# Patient Record
Sex: Male | Born: 1955 | Race: White | Hispanic: No | Smoking: Current every day smoker
Health system: Southern US, Community
[De-identification: ages and names within clinical notes are randomized; demographics above are authoritative.]

## PROBLEM LIST (undated history)

## (undated) DIAGNOSIS — F329 Major depressive disorder, single episode, unspecified: Secondary | ICD-10-CM

## (undated) DIAGNOSIS — F32A Depression, unspecified: Secondary | ICD-10-CM

## (undated) DIAGNOSIS — J45909 Unspecified asthma, uncomplicated: Secondary | ICD-10-CM

## (undated) DIAGNOSIS — I219 Acute myocardial infarction, unspecified: Secondary | ICD-10-CM

## (undated) DIAGNOSIS — I10 Essential (primary) hypertension: Secondary | ICD-10-CM

## (undated) DIAGNOSIS — IMO0002 Reserved for concepts with insufficient information to code with codable children: Secondary | ICD-10-CM

## (undated) DIAGNOSIS — E119 Type 2 diabetes mellitus without complications: Secondary | ICD-10-CM

## (undated) DIAGNOSIS — E7800 Pure hypercholesterolemia, unspecified: Secondary | ICD-10-CM

## (undated) HISTORY — PX: SPLENECTOMY: SUR1306

## (undated) HISTORY — DX: Pure hypercholesterolemia, unspecified: E78.00

## (undated) HISTORY — DX: Essential (primary) hypertension: I10

## (undated) HISTORY — DX: Reserved for concepts with insufficient information to code with codable children: IMO0002

## (undated) HISTORY — DX: Acute myocardial infarction, unspecified: I21.9

## (undated) HISTORY — PX: BACK SURGERY: SHX140

## (undated) HISTORY — DX: Unspecified asthma, uncomplicated: J45.909

## (undated) HISTORY — DX: Type 2 diabetes mellitus without complications: E11.9

---

## 1997-08-18 ENCOUNTER — Emergency Department (HOSPITAL_COMMUNITY): Admission: EM | Admit: 1997-08-18 | Discharge: 1997-08-18 | Payer: Self-pay | Admitting: Emergency Medicine

## 1998-09-17 ENCOUNTER — Emergency Department (HOSPITAL_COMMUNITY): Admission: EM | Admit: 1998-09-17 | Discharge: 1998-09-17 | Payer: Self-pay | Admitting: Emergency Medicine

## 1999-04-01 ENCOUNTER — Emergency Department (HOSPITAL_COMMUNITY): Admission: EM | Admit: 1999-04-01 | Discharge: 1999-04-01 | Payer: Self-pay | Admitting: Emergency Medicine

## 1999-07-24 ENCOUNTER — Inpatient Hospital Stay (HOSPITAL_COMMUNITY): Admission: EM | Admit: 1999-07-24 | Discharge: 1999-07-29 | Payer: Self-pay | Admitting: Emergency Medicine

## 1999-08-19 ENCOUNTER — Inpatient Hospital Stay (HOSPITAL_COMMUNITY): Admission: EM | Admit: 1999-08-19 | Discharge: 1999-08-26 | Payer: Self-pay | Admitting: Psychiatry

## 1999-12-27 ENCOUNTER — Emergency Department (HOSPITAL_COMMUNITY): Admission: EM | Admit: 1999-12-27 | Discharge: 1999-12-27 | Payer: Self-pay | Admitting: Emergency Medicine

## 1999-12-27 ENCOUNTER — Encounter: Payer: Self-pay | Admitting: Emergency Medicine

## 2000-01-03 ENCOUNTER — Encounter (INDEPENDENT_AMBULATORY_CARE_PROVIDER_SITE_OTHER): Payer: Self-pay

## 2000-01-03 ENCOUNTER — Inpatient Hospital Stay (HOSPITAL_COMMUNITY): Admission: EM | Admit: 2000-01-03 | Discharge: 2000-01-11 | Payer: Self-pay | Admitting: Internal Medicine

## 2000-01-04 ENCOUNTER — Encounter: Payer: Self-pay | Admitting: Internal Medicine

## 2000-01-09 ENCOUNTER — Encounter: Payer: Self-pay | Admitting: Internal Medicine

## 2000-08-27 ENCOUNTER — Inpatient Hospital Stay (HOSPITAL_COMMUNITY): Admission: EM | Admit: 2000-08-27 | Discharge: 2000-09-03 | Payer: Self-pay | Admitting: Psychiatry

## 2001-07-22 ENCOUNTER — Emergency Department (HOSPITAL_COMMUNITY): Admission: EM | Admit: 2001-07-22 | Discharge: 2001-07-22 | Payer: Self-pay | Admitting: Emergency Medicine

## 2001-08-01 ENCOUNTER — Encounter: Admission: RE | Admit: 2001-08-01 | Discharge: 2001-08-01 | Payer: Self-pay | Admitting: Infectious Diseases

## 2001-08-12 ENCOUNTER — Encounter: Admission: RE | Admit: 2001-08-12 | Discharge: 2001-08-12 | Payer: Self-pay | Admitting: Infectious Diseases

## 2001-08-23 ENCOUNTER — Emergency Department (HOSPITAL_COMMUNITY): Admission: EM | Admit: 2001-08-23 | Discharge: 2001-08-23 | Payer: Self-pay | Admitting: Emergency Medicine

## 2002-02-19 ENCOUNTER — Inpatient Hospital Stay (HOSPITAL_COMMUNITY): Admission: EM | Admit: 2002-02-19 | Discharge: 2002-02-20 | Payer: Self-pay | Admitting: *Deleted

## 2002-02-19 ENCOUNTER — Encounter: Payer: Self-pay | Admitting: Internal Medicine

## 2002-02-19 ENCOUNTER — Encounter: Payer: Self-pay | Admitting: *Deleted

## 2002-04-15 ENCOUNTER — Encounter: Payer: Self-pay | Admitting: Emergency Medicine

## 2002-04-15 ENCOUNTER — Inpatient Hospital Stay (HOSPITAL_COMMUNITY): Admission: EM | Admit: 2002-04-15 | Discharge: 2002-04-15 | Payer: Self-pay | Admitting: Emergency Medicine

## 2002-04-18 ENCOUNTER — Emergency Department (HOSPITAL_COMMUNITY): Admission: EM | Admit: 2002-04-18 | Discharge: 2002-04-18 | Payer: Self-pay | Admitting: Emergency Medicine

## 2002-05-24 ENCOUNTER — Emergency Department (HOSPITAL_COMMUNITY): Admission: EM | Admit: 2002-05-24 | Discharge: 2002-05-24 | Payer: Self-pay | Admitting: Emergency Medicine

## 2002-05-24 ENCOUNTER — Encounter: Payer: Self-pay | Admitting: Emergency Medicine

## 2002-06-13 ENCOUNTER — Emergency Department (HOSPITAL_COMMUNITY): Admission: EM | Admit: 2002-06-13 | Discharge: 2002-06-13 | Payer: Self-pay | Admitting: Emergency Medicine

## 2002-06-13 ENCOUNTER — Encounter: Payer: Self-pay | Admitting: Emergency Medicine

## 2002-10-03 ENCOUNTER — Emergency Department (HOSPITAL_COMMUNITY): Admission: EM | Admit: 2002-10-03 | Discharge: 2002-10-03 | Payer: Self-pay | Admitting: Emergency Medicine

## 2003-06-14 ENCOUNTER — Emergency Department (HOSPITAL_COMMUNITY): Admission: AD | Admit: 2003-06-14 | Discharge: 2003-06-14 | Payer: Self-pay | Admitting: Family Medicine

## 2003-09-15 ENCOUNTER — Emergency Department (HOSPITAL_COMMUNITY): Admission: EM | Admit: 2003-09-15 | Discharge: 2003-09-15 | Payer: Self-pay | Admitting: *Deleted

## 2005-03-06 ENCOUNTER — Ambulatory Visit: Payer: Self-pay | Admitting: Internal Medicine

## 2005-03-10 ENCOUNTER — Ambulatory Visit: Payer: Self-pay | Admitting: *Deleted

## 2005-03-21 ENCOUNTER — Ambulatory Visit: Payer: Self-pay | Admitting: Internal Medicine

## 2005-08-10 ENCOUNTER — Ambulatory Visit: Payer: Self-pay | Admitting: *Deleted

## 2005-11-27 ENCOUNTER — Emergency Department (HOSPITAL_COMMUNITY): Admission: EM | Admit: 2005-11-27 | Discharge: 2005-11-27 | Payer: Self-pay | Admitting: Emergency Medicine

## 2006-01-25 ENCOUNTER — Ambulatory Visit: Payer: Self-pay | Admitting: Infectious Diseases

## 2006-01-25 ENCOUNTER — Inpatient Hospital Stay (HOSPITAL_COMMUNITY): Admission: EM | Admit: 2006-01-25 | Discharge: 2006-02-02 | Payer: Self-pay | Admitting: Emergency Medicine

## 2006-01-25 ENCOUNTER — Ambulatory Visit: Payer: Self-pay | Admitting: Cardiology

## 2006-01-26 ENCOUNTER — Encounter: Payer: Self-pay | Admitting: Cardiology

## 2006-02-12 ENCOUNTER — Emergency Department (HOSPITAL_COMMUNITY): Admission: EM | Admit: 2006-02-12 | Discharge: 2006-02-12 | Payer: Self-pay | Admitting: Family Medicine

## 2006-02-12 ENCOUNTER — Ambulatory Visit: Payer: Self-pay | Admitting: Internal Medicine

## 2006-05-29 ENCOUNTER — Ambulatory Visit: Payer: Self-pay | Admitting: Internal Medicine

## 2006-06-19 ENCOUNTER — Ambulatory Visit: Payer: Self-pay | Admitting: Internal Medicine

## 2006-11-14 ENCOUNTER — Encounter (INDEPENDENT_AMBULATORY_CARE_PROVIDER_SITE_OTHER): Payer: Self-pay | Admitting: *Deleted

## 2007-02-14 ENCOUNTER — Ambulatory Visit: Payer: Self-pay | Admitting: Family Medicine

## 2007-02-14 ENCOUNTER — Observation Stay (HOSPITAL_COMMUNITY): Admission: EM | Admit: 2007-02-14 | Discharge: 2007-02-15 | Payer: Self-pay | Admitting: Emergency Medicine

## 2007-03-27 ENCOUNTER — Ambulatory Visit: Payer: Self-pay | Admitting: Internal Medicine

## 2007-05-08 ENCOUNTER — Ambulatory Visit: Payer: Self-pay | Admitting: Internal Medicine

## 2007-05-09 ENCOUNTER — Inpatient Hospital Stay (HOSPITAL_COMMUNITY): Admission: EM | Admit: 2007-05-09 | Discharge: 2007-05-10 | Payer: Self-pay | Admitting: Emergency Medicine

## 2007-05-30 ENCOUNTER — Ambulatory Visit: Payer: Self-pay | Admitting: Internal Medicine

## 2007-07-21 ENCOUNTER — Emergency Department (HOSPITAL_COMMUNITY): Admission: EM | Admit: 2007-07-21 | Discharge: 2007-07-21 | Payer: Self-pay | Admitting: Emergency Medicine

## 2007-07-25 ENCOUNTER — Ambulatory Visit: Payer: Self-pay | Admitting: Internal Medicine

## 2007-07-28 ENCOUNTER — Emergency Department (HOSPITAL_COMMUNITY): Admission: EM | Admit: 2007-07-28 | Discharge: 2007-07-28 | Payer: Self-pay | Admitting: Emergency Medicine

## 2007-08-27 ENCOUNTER — Emergency Department (HOSPITAL_COMMUNITY): Admission: EM | Admit: 2007-08-27 | Discharge: 2007-08-27 | Payer: Self-pay | Admitting: Emergency Medicine

## 2007-10-02 ENCOUNTER — Inpatient Hospital Stay (HOSPITAL_COMMUNITY): Admission: EM | Admit: 2007-10-02 | Discharge: 2007-10-10 | Payer: Self-pay | Admitting: Emergency Medicine

## 2008-12-23 ENCOUNTER — Emergency Department (HOSPITAL_COMMUNITY): Admission: EM | Admit: 2008-12-23 | Discharge: 2008-12-23 | Payer: Self-pay | Admitting: Emergency Medicine

## 2009-03-11 ENCOUNTER — Emergency Department (HOSPITAL_COMMUNITY): Admission: EM | Admit: 2009-03-11 | Discharge: 2009-03-11 | Payer: Self-pay | Admitting: Emergency Medicine

## 2009-04-05 ENCOUNTER — Ambulatory Visit: Payer: Self-pay | Admitting: Internal Medicine

## 2009-04-05 ENCOUNTER — Inpatient Hospital Stay (HOSPITAL_COMMUNITY): Admission: EM | Admit: 2009-04-05 | Discharge: 2009-04-13 | Payer: Self-pay | Admitting: Emergency Medicine

## 2009-08-10 ENCOUNTER — Emergency Department (HOSPITAL_COMMUNITY): Admission: EM | Admit: 2009-08-10 | Discharge: 2009-08-10 | Payer: Self-pay | Admitting: Emergency Medicine

## 2009-08-26 ENCOUNTER — Emergency Department (HOSPITAL_COMMUNITY): Admission: EM | Admit: 2009-08-26 | Discharge: 2009-08-26 | Payer: Self-pay | Admitting: Emergency Medicine

## 2009-10-11 ENCOUNTER — Emergency Department (HOSPITAL_COMMUNITY): Admission: EM | Admit: 2009-10-11 | Discharge: 2009-10-11 | Payer: Self-pay | Admitting: Emergency Medicine

## 2009-10-27 ENCOUNTER — Encounter: Admission: RE | Admit: 2009-10-27 | Discharge: 2009-10-27 | Payer: Self-pay | Admitting: Neurosurgery

## 2010-01-04 DIAGNOSIS — F1120 Opioid dependence, uncomplicated: Secondary | ICD-10-CM

## 2010-01-06 ENCOUNTER — Ambulatory Visit: Payer: Self-pay | Admitting: Internal Medicine

## 2010-01-06 ENCOUNTER — Inpatient Hospital Stay (HOSPITAL_COMMUNITY): Admission: EM | Admit: 2010-01-06 | Discharge: 2010-01-07 | Payer: Self-pay | Admitting: Emergency Medicine

## 2010-05-11 LAB — CBC
HCT: 40.1 % (ref 39.0–52.0)
HCT: 42.3 % (ref 39.0–52.0)
Hemoglobin: 13.7 g/dL (ref 13.0–17.0)
MCV: 95.3 fL (ref 78.0–100.0)
Platelets: 305 10*3/uL (ref 150–400)
RBC: 4.25 MIL/uL (ref 4.22–5.81)
RDW: 14.2 % (ref 11.5–15.5)
RDW: 14.3 % (ref 11.5–15.5)
WBC: 11.7 10*3/uL — ABNORMAL HIGH (ref 4.0–10.5)
WBC: 15.2 10*3/uL — ABNORMAL HIGH (ref 4.0–10.5)
WBC: 19.4 10*3/uL — ABNORMAL HIGH (ref 4.0–10.5)

## 2010-05-11 LAB — BLOOD GAS, ARTERIAL
Patient temperature: 98.6
pH, Arterial: 7.389 (ref 7.350–7.450)

## 2010-05-11 LAB — ETHANOL: Alcohol, Ethyl (B): <5 mg/dL (ref 0–10)

## 2010-05-11 LAB — URINALYSIS, ROUTINE W REFLEX MICROSCOPIC
Glucose, UA: NEGATIVE mg/dL
Ketones, ur: NEGATIVE mg/dL
Leukocytes, UA: NEGATIVE
Protein, ur: NEGATIVE mg/dL

## 2010-05-11 LAB — COMPREHENSIVE METABOLIC PANEL
ALT: 48 U/L (ref 0–53)
AST: 128 U/L — ABNORMAL HIGH (ref 0–37)
Albumin: 3.9 g/dL (ref 3.5–5.2)
Alkaline Phosphatase: 59 U/L (ref 39–117)
Chloride: 92 mEq/L — ABNORMAL LOW (ref 96–112)
Potassium: 4.2 mEq/L (ref 3.5–5.1)
Sodium: 129 mEq/L — ABNORMAL LOW (ref 135–145)
Total Bilirubin: 0.8 mg/dL (ref 0.3–1.2)
Total Protein: 6.8 g/dL (ref 6.0–8.3)

## 2010-05-11 LAB — BASIC METABOLIC PANEL
BUN: 16 mg/dL (ref 6–23)
BUN: 30 mg/dL — ABNORMAL HIGH (ref 6–23)
Chloride: 102 mEq/L (ref 96–112)
Creatinine, Ser: 1.9 mg/dL — ABNORMAL HIGH (ref 0.4–1.5)
GFR calc non Af Amer: 37 mL/min — ABNORMAL LOW (ref >60)
Glucose, Bld: 124 mg/dL — ABNORMAL HIGH (ref 70–99)
Potassium: 4.7 mEq/L (ref 3.5–5.1)

## 2010-05-11 LAB — DIFFERENTIAL
Basophils Relative: 0 % (ref 0–1)
Eosinophils Absolute: 0.3 10*3/uL (ref 0.0–0.7)
Eosinophils Relative: 2 % (ref 0–5)
Monocytes Absolute: 1.8 10*3/uL — ABNORMAL HIGH (ref 0.1–1.0)
Monocytes Relative: 11 % (ref 3–12)

## 2010-05-11 LAB — TROPONIN I: Troponin I: 1.6 ng/mL (ref 0.00–0.06)

## 2010-05-11 LAB — URINE CULTURE
Colony Count: NO GROWTH
Culture  Setup Time: 201111100308
Culture: NO GROWTH

## 2010-05-11 LAB — LIPID PANEL
Cholesterol: 151 mg/dL (ref 0–200)
HDL: 37 mg/dL — ABNORMAL LOW (ref >39)
Triglycerides: 71 mg/dL (ref <150)

## 2010-05-11 LAB — CARDIAC PANEL(CRET KIN+CKTOT+MB+TROPI)
CK, MB: 160.9 ng/mL (ref 0.3–4.0)
Total CK: 5625 U/L — ABNORMAL HIGH (ref 7–232)
Troponin I: 4.44 ng/mL (ref 0.00–0.06)

## 2010-05-11 LAB — POCT CARDIAC MARKERS
CKMB, poc: 75.8 ng/mL (ref 1.0–8.0)
Myoglobin, poc: 470 ng/mL (ref 12–200)
Troponin i, poc: 0.37 ng/mL (ref 0.00–0.09)

## 2010-05-11 LAB — APTT: aPTT: 21 seconds — ABNORMAL LOW (ref 24–37)

## 2010-05-11 LAB — HEPARIN LEVEL (UNFRACTIONATED)
Heparin Unfractionated: 0.11 IU/mL — ABNORMAL LOW (ref 0.30–0.70)
Heparin Unfractionated: <0.1 IU/mL — ABNORMAL LOW (ref 0.30–0.70)

## 2010-05-11 LAB — RAPID URINE DRUG SCREEN, HOSP PERFORMED
Amphetamines: NOT DETECTED
Barbiturates: NOT DETECTED
Benzodiazepines: POSITIVE — AB

## 2010-05-11 LAB — URINE MICROSCOPIC-ADD ON

## 2010-05-11 LAB — LACTIC ACID, PLASMA: Lactic Acid, Venous: 1.5 mmol/L (ref 0.5–2.2)

## 2010-05-12 NOTE — Discharge Summary (Signed)
NAMEYOSHIMI, [name]               ACCOUNT NO.:  [id number]  MEDICAL RECORD NO.:  [id number]          PATIENT TYPE:  INP  LOCATION:  4738                         FACILITY:  [facility]  PHYSICIAN:  Rollene Rotunda, MD, FACCDATE OF BIRTH:  March 14, 1955  DATE OF ADMISSION:  01/05/2010 DATE OF DISCHARGE:  01/07/2010                              DISCHARGE SUMMARY   DISCHARGE DIAGNOSES: 1. Elevated cardiac enzymes with multifactorial etiology.     a.     Known coronary artery disease, history renal insufficiency,      currently rhabdomyolysis on admission.     b.     Initial point-of-care markers; troponin 0.37.  First full      set of enzymes; CK 5880, troponin 1.6.  Second full set of      enzymes; CK 5625, MB 160.9, relative index 2.9, troponin 3.10.      Third and final set; CK 5370, MB 117.8, relative index 2.2 and      troponin-I 4.44.     c.     The patient left against medical advice prior to any      ischemic evaluation being completed. 2. Rhabdomyolysis.     a.     Initial CK 5880, initial creatinine 5.26 with BUN of 45 up      from baseline creatinine of 0.9 - 1.0. 3. Narcotic overdose with question of suicide attempt.     a.     Psychiatry consult completed with recommendation to      discontinue sitter, okay for discharge from Psychiatry      perspective. 4. Ongoing tobacco abuse (2 ppd).     a.     Tobacco cessation consult completed on January 06, 2010.  SECONDARY DIAGNOSES: 1. Coronary artery disease.     a.     Cardiac catheterization in December 2007:  100% proximal      right coronary artery occlusion, distal right coronary artery and      posterior descending artery supplied left circumflex system, left      circumflex mid 30% stenosis and 50% stenosis involving obtuse      marginal 1, left internal mammary artery and left anterior      descending artery without significant disease.  Normal left      ventricular systolic function with left ventricular ejection  fraction of 60%. 2. Hypertension. 3. Hyperlipidemia. 4. History of renal failure. 5. History of polysubstance abuse. 6. Chronic back pain.  SURGICAL HISTORY: 1. S/P lower back surgery. 2. 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  ALLERGIES AND INTOLERANCES: 1. MORPHINE SULFATE (itching). 2. MEPERIDINE (itching).  PROCEDURES: 1. EKG on January 06, 2010:  Sinus tachycardia, 100 bpm, 1 mm ST     depression in lead 3 and lead 6, otherwise nonspecific ST changes. 2. Chest x-ray on January 06, 2010:  No active cardiopulmonary     disease.  HISTORY OF PRESENT ILLNESS:  [name] is a 55 year old gentleman with the above-noted complex medical history who presented to the hospital with altered mental status following a drug overdose with narcotics.  The patient takes oxymorphone 10  mg three tablets p.o. daily as well as oxycodone for chronic low back pain.  According to the patient, he discovered his girlfriend passed away yesterday may have taken more of his regular pain medicine than usual, question suicide attempt.  The patient has been obviously very depressed about the news.  The patient was brought to the emergency department by family friend after he was noted to have altered mental status.  Upon arrival, he was given Narcan and he immediately woke up/regained consciousness.  He then complained of diffuse myalgias including chest discomfort.  Initial cardiac enzymes were elevated in the setting of significantly elevated CK as well.  EKG showed nonspecific ST changes in inferior-lateral leads.  Secondary to his complaints of chest discomfort and elevated cardiac enzymes, Cardiology was asked to admit the patient.  The patient is not a reliable historian currently and is very difficult to tease out whether or not this chest discomfort is significantly different from the rest of his myalgias.  Plan at that time was to admit the patient cycle cardiac  enzymes and follow renal function closely as his creatinine was 5.2 on initial BMET.  The patient was also given IV fluids in the emergency department and plan was continue these and follow up with repeat BMETs.  HOSPITAL COURSE:  The patient was admitted and cardiac enzymes were cycled.  Psychiatric Services were contacted for consultation given concern over possible suicide attempt.  It was determination by the cardiologist that the patient's cardiac enzymes were not indicative of acute coronary syndrome, but rather related to his known chronic coronary artery disease and rhabdomyolysis.  The patient had no further chest discomfort and actually left AMA on January 07, 2010, but did have his Psych consult completed beforehand.  Per Psychiatry, the patient was stable for a Psych standpoint for discharge.  Unfortunately no followup appointments or discharge medications could be completed as the patient left prior to being reevaluated by Cardiology.  No ischemic workup was completed prior to his leaving against the medical advice.  DISCHARGE LABS:  WBC 11.7, HGB 14.2, HCT 42.3, PLT count is 324.  WBC differential on admission was within normal limits except for absolute neutrophils at 9.3%, absolute lymphocytes at 6.0% and absolute monocytes at 1.8%.  Protime 12.9, INR 0.95.  Sodium 137, potassium 4.7, chloride 102, bicarb 25, BUN 16, creatinine 0.93, glucose ranged from 80 to 124. Liver function tests within normal limits on admission except for AST 128, albumin 3.9, calcium 8.9, lactic acid venous 1.5.  Point-of-care markers; troponin 0.37, CK-MB of 76.4.  First full set of enzymes; CK 5880, troponin 1.60.  Second full set; CK 5625, MB 160.9, relative index 2.9, troponin-I 3.10.  Third full set; CK 5370, CK-MB 117.8, relative index 2.2 and troponin 4.44.  Total cholesterol 151, triglycerides 71, HDL 37, LDL 100, total cholesterol/HDL ratio 4.1.  Urinary creatinine 56.0, urine sodium  60, urine myoglobin 160.  Urinalysis, cloudy with a large amount of blood, but otherwise within normal limits.  Urine culture, no growth.  FOLLOWUP PLANS AND APPOINTMENTS:  Not applicable.  DISCHARGE MEDICATIONS:  No changes able to be made secondary to placed in leaving against medical advice.  DURATION OF DISCHARGE ENCOUNTER INCLUDING PHYSICIAN TIME:  Thiry five minutes.     Jarrett Ables, PAC   ______________________________ Rollene Rotunda, MD, [facility]    MS/MEDQ  D:  05/03/2010  T:  05/04/2010  Job:  [id number]  Electronically Signed by Jarrett Ables PAC on 05/05/2010 12:54:54 PM Electronically Signed  by Rollene Rotunda MD [facility] on 05/12/2010 07:55:06 PM

## 2010-05-15 LAB — POCT I-STAT, CHEM 8
BUN: 21 mg/dL (ref 6–23)
Chloride: 97 mEq/L (ref 96–112)
Creatinine, Ser: 1.5 mg/dL (ref 0.4–1.5)
Sodium: 130 mEq/L — ABNORMAL LOW (ref 135–145)
TCO2: 28 mmol/L (ref 0–100)

## 2010-05-16 LAB — CBC
HCT: 49.5 % (ref 39.0–52.0)
Hemoglobin: 17.1 g/dL — ABNORMAL HIGH (ref 13.0–17.0)
MCH: 33.4 pg (ref 26.0–34.0)
MCHC: 34.5 g/dL (ref 30.0–36.0)
MCV: 96.8 fL (ref 78.0–100.0)

## 2010-05-16 LAB — RAPID URINE DRUG SCREEN, HOSP PERFORMED
Amphetamines: NOT DETECTED
Barbiturates: NOT DETECTED
Cocaine: NOT DETECTED
Opiates: NOT DETECTED
Tetrahydrocannabinol: NOT DETECTED

## 2010-05-16 LAB — BASIC METABOLIC PANEL
CO2: 26 mEq/L (ref 19–32)
Calcium: 9.6 mg/dL (ref 8.4–10.5)
Chloride: 101 mEq/L (ref 96–112)
Glucose, Bld: 111 mg/dL — ABNORMAL HIGH (ref 70–99)
Potassium: 3.8 mEq/L (ref 3.5–5.1)
Sodium: 137 mEq/L (ref 135–145)

## 2010-05-16 LAB — DIFFERENTIAL
Basophils Relative: 1 % (ref 0–1)
Eosinophils Absolute: 0.6 10*3/uL (ref 0.0–0.7)
Eosinophils Relative: 5 % (ref 0–5)
Lymphs Abs: 5.7 10*3/uL — ABNORMAL HIGH (ref 0.7–4.0)
Monocytes Absolute: 1.2 10*3/uL — ABNORMAL HIGH (ref 0.1–1.0)
Monocytes Relative: 9 % (ref 3–12)

## 2010-05-20 LAB — BASIC METABOLIC PANEL
BUN: 11 mg/dL (ref 6–23)
BUN: 14 mg/dL (ref 6–23)
BUN: 9 mg/dL (ref 6–23)
BUN: 9 mg/dL (ref 6–23)
CO2: 25 mEq/L (ref 19–32)
CO2: 26 mEq/L (ref 19–32)
CO2: 29 mEq/L (ref 19–32)
CO2: 31 mEq/L (ref 19–32)
CO2: 31 mEq/L (ref 19–32)
Calcium: 8.6 mg/dL (ref 8.4–10.5)
Calcium: 8.7 mg/dL (ref 8.4–10.5)
Calcium: 8.7 mg/dL (ref 8.4–10.5)
Calcium: 9 mg/dL (ref 8.4–10.5)
Chloride: 105 mEq/L (ref 96–112)
Chloride: 97 mEq/L (ref 96–112)
Chloride: 99 mEq/L (ref 96–112)
Creatinine, Ser: 0.66 mg/dL (ref 0.4–1.5)
Creatinine, Ser: 0.7 mg/dL (ref 0.4–1.5)
Creatinine, Ser: 0.75 mg/dL (ref 0.4–1.5)
Creatinine, Ser: 0.76 mg/dL (ref 0.4–1.5)
GFR calc Af Amer: >60 mL/min (ref >60)
GFR calc Af Amer: >60 mL/min (ref >60)
GFR calc Af Amer: >60 mL/min (ref >60)
GFR calc non Af Amer: >60 mL/min (ref >60)
GFR calc non Af Amer: >60 mL/min (ref >60)
GFR calc non Af Amer: >60 mL/min (ref >60)
Glucose, Bld: 101 mg/dL — ABNORMAL HIGH (ref 70–99)
Glucose, Bld: 103 mg/dL — ABNORMAL HIGH (ref 70–99)
Glucose, Bld: 108 mg/dL — ABNORMAL HIGH (ref 70–99)
Glucose, Bld: 112 mg/dL — ABNORMAL HIGH (ref 70–99)
Glucose, Bld: 124 mg/dL — ABNORMAL HIGH (ref 70–99)
Glucose, Bld: 93 mg/dL (ref 70–99)
Potassium: 3.4 mEq/L — ABNORMAL LOW (ref 3.5–5.1)
Potassium: 3.4 mEq/L — ABNORMAL LOW (ref 3.5–5.1)
Potassium: 3.6 mEq/L (ref 3.5–5.1)
Potassium: 3.6 mEq/L (ref 3.5–5.1)
Potassium: 3.7 mEq/L (ref 3.5–5.1)
Potassium: 3.8 mEq/L (ref 3.5–5.1)
Sodium: 134 mEq/L — ABNORMAL LOW (ref 135–145)
Sodium: 139 mEq/L (ref 135–145)

## 2010-05-20 LAB — POCT I-STAT 3, ART BLOOD GAS (G3+)
Acid-Base Excess: 2 mmol/L (ref 0.0–2.0)
Bicarbonate: 24 mEq/L (ref 20.0–24.0)
Bicarbonate: 24.2 mEq/L — ABNORMAL HIGH (ref 20.0–24.0)
Bicarbonate: 27.7 mEq/L — ABNORMAL HIGH (ref 20.0–24.0)
Bicarbonate: 29.1 mEq/L — ABNORMAL HIGH (ref 20.0–24.0)
O2 Saturation: 100 %
O2 Saturation: 95 %
O2 Saturation: 97 %
Patient temperature: 97.5
Patient temperature: 98.6
Patient temperature: 99
TCO2: 25 mmol/L (ref 0–100)
TCO2: 26 mmol/L (ref 0–100)
TCO2: 28 mmol/L (ref 0–100)
TCO2: 29 mmol/L (ref 0–100)
pCO2 arterial: 37 mmHg (ref 35.0–45.0)
pCO2 arterial: 44.5 mmHg (ref 35.0–45.0)
pCO2 arterial: 46.1 mmHg — ABNORMAL HIGH (ref 35.0–45.0)
pH, Arterial: 7.43 (ref 7.350–7.450)
pO2, Arterial: 113 mmHg — ABNORMAL HIGH (ref 80.0–100.0)
pO2, Arterial: 80 mmHg (ref 80.0–100.0)
pO2, Arterial: 86 mmHg (ref 80.0–100.0)

## 2010-05-20 LAB — COMPREHENSIVE METABOLIC PANEL WITH GFR
AST: 41 U/L — ABNORMAL HIGH (ref 0–37)
Albumin: 2.5 g/dL — ABNORMAL LOW (ref 3.5–5.2)
Alkaline Phosphatase: 68 U/L (ref 39–117)
BUN: 10 mg/dL (ref 6–23)
Chloride: 105 meq/L (ref 96–112)
GFR calc Af Amer: >60 mL/min (ref >60)
Potassium: 3.7 meq/L (ref 3.5–5.1)
Sodium: 137 meq/L (ref 135–145)
Total Protein: 5.8 g/dL — ABNORMAL LOW (ref 6.0–8.3)

## 2010-05-20 LAB — CBC
HCT: 35.4 % — ABNORMAL LOW (ref 39.0–52.0)
HCT: 36.5 % — ABNORMAL LOW (ref 39.0–52.0)
HCT: 37.3 % — ABNORMAL LOW (ref 39.0–52.0)
HCT: 38.1 % — ABNORMAL LOW (ref 39.0–52.0)
HCT: 40.5 % (ref 39.0–52.0)
HCT: 47.9 % (ref 39.0–52.0)
Hemoglobin: 12.3 g/dL — ABNORMAL LOW (ref 13.0–17.0)
Hemoglobin: 12.8 g/dL — ABNORMAL LOW (ref 13.0–17.0)
Hemoglobin: 12.9 g/dL — ABNORMAL LOW (ref 13.0–17.0)
Hemoglobin: 13.1 g/dL (ref 13.0–17.0)
Hemoglobin: 18.1 g/dL — ABNORMAL HIGH (ref 13.0–17.0)
MCHC: 33.4 g/dL (ref 30.0–36.0)
MCHC: 33.5 g/dL (ref 30.0–36.0)
MCHC: 33.9 g/dL (ref 30.0–36.0)
MCHC: 34.3 g/dL (ref 30.0–36.0)
MCHC: 34.6 g/dL (ref 30.0–36.0)
MCV: 98.8 fL (ref 78.0–100.0)
MCV: 98.9 fL (ref 78.0–100.0)
MCV: 99.4 fL (ref 78.0–100.0)
Platelets: 178 K/uL (ref 150–400)
Platelets: 217 10*3/uL (ref 150–400)
Platelets: 313 10*3/uL (ref 150–400)
Platelets: 338 10*3/uL (ref 150–400)
Platelets: 354 10*3/uL (ref 150–400)
Platelets: 409 10*3/uL — ABNORMAL HIGH (ref 150–400)
RBC: 3.59 MIL/uL — ABNORMAL LOW (ref 4.22–5.81)
RBC: 3.75 MIL/uL — ABNORMAL LOW (ref 4.22–5.81)
RBC: 3.84 MIL/uL — ABNORMAL LOW (ref 4.22–5.81)
RDW: 13.6 % (ref 11.5–15.5)
RDW: 13.6 % (ref 11.5–15.5)
RDW: 13.8 % (ref 11.5–15.5)
RDW: 13.8 % (ref 11.5–15.5)
RDW: 13.9 % (ref 11.5–15.5)
RDW: 14 % (ref 11.5–15.5)
WBC: 14.3 10*3/uL — ABNORMAL HIGH (ref 4.0–10.5)
WBC: 16.5 10*3/uL — ABNORMAL HIGH (ref 4.0–10.5)
WBC: 17.5 K/uL — ABNORMAL HIGH (ref 4.0–10.5)
WBC: 17.7 10*3/uL — ABNORMAL HIGH (ref 4.0–10.5)
WBC: 20.2 10*3/uL — ABNORMAL HIGH (ref 4.0–10.5)
WBC: 20.5 10*3/uL — ABNORMAL HIGH (ref 4.0–10.5)

## 2010-05-20 LAB — POCT CARDIAC MARKERS
CKMB, poc: 21.6 ng/mL (ref 1.0–8.0)
Myoglobin, poc: >500 ng/mL (ref 12–200)

## 2010-05-20 LAB — PHOSPHORUS
Phosphorus: 3.1 mg/dL (ref 2.3–4.6)
Phosphorus: 3.8 mg/dL (ref 2.3–4.6)
Phosphorus: 4.4 mg/dL (ref 2.3–4.6)

## 2010-05-20 LAB — SALICYLATE LEVEL: Salicylate Lvl: <4 mg/dL (ref 2.8–20.0)

## 2010-05-20 LAB — EXPECTORATED SPUTUM ASSESSMENT W GRAM STAIN, RFLX TO RESP C

## 2010-05-20 LAB — CULTURE, BLOOD (ROUTINE X 2)
Culture: NO GROWTH
Culture: NO GROWTH
Culture: NO GROWTH
Culture: NO GROWTH

## 2010-05-20 LAB — COMPREHENSIVE METABOLIC PANEL
ALT: 23 U/L (ref 0–53)
ALT: 26 U/L (ref 0–53)
AST: 51 U/L — ABNORMAL HIGH (ref 0–37)
Albumin: 4 g/dL (ref 3.5–5.2)
Alkaline Phosphatase: 43 U/L (ref 39–117)
Alkaline Phosphatase: 72 U/L (ref 39–117)
BUN: 10 mg/dL (ref 6–23)
CO2: 26 mEq/L (ref 19–32)
Calcium: 8.1 mg/dL — ABNORMAL LOW (ref 8.4–10.5)
Calcium: 9 mg/dL (ref 8.4–10.5)
Chloride: 103 mEq/L (ref 96–112)
Creatinine, Ser: 0.73 mg/dL (ref 0.4–1.5)
GFR calc Af Amer: >60 mL/min (ref >60)
GFR calc non Af Amer: >60 mL/min (ref >60)
GFR calc non Af Amer: >60 mL/min (ref >60)
Glucose, Bld: 106 mg/dL — ABNORMAL HIGH (ref 70–99)
Glucose, Bld: 117 mg/dL — ABNORMAL HIGH (ref 70–99)
Potassium: 3.2 mEq/L — ABNORMAL LOW (ref 3.5–5.1)
Potassium: 4 mEq/L (ref 3.5–5.1)
Sodium: 133 mEq/L — ABNORMAL LOW (ref 135–145)
Total Bilirubin: 0.7 mg/dL (ref 0.3–1.2)
Total Bilirubin: 0.9 mg/dL (ref 0.3–1.2)
Total Protein: 7.8 g/dL (ref 6.0–8.3)

## 2010-05-20 LAB — URINALYSIS, ROUTINE W REFLEX MICROSCOPIC
Bilirubin Urine: NEGATIVE
Bilirubin Urine: NEGATIVE
Glucose, UA: NEGATIVE mg/dL
Hgb urine dipstick: NEGATIVE
Nitrite: NEGATIVE
Specific Gravity, Urine: 1.011 (ref 1.005–1.030)
Specific Gravity, Urine: 1.019 (ref 1.005–1.030)
Urobilinogen, UA: 0.2 mg/dL (ref 0.0–1.0)
pH: 6 (ref 5.0–8.0)
pH: 6 (ref 5.0–8.0)

## 2010-05-20 LAB — RAPID URINE DRUG SCREEN, HOSP PERFORMED
Barbiturates: NOT DETECTED
Cocaine: POSITIVE — AB
Opiates: NOT DETECTED
Tetrahydrocannabinol: POSITIVE — AB

## 2010-05-20 LAB — GLUCOSE, CAPILLARY
Glucose-Capillary: 100 mg/dL — ABNORMAL HIGH (ref 70–99)
Glucose-Capillary: 100 mg/dL — ABNORMAL HIGH (ref 70–99)
Glucose-Capillary: 102 mg/dL — ABNORMAL HIGH (ref 70–99)
Glucose-Capillary: 103 mg/dL — ABNORMAL HIGH (ref 70–99)
Glucose-Capillary: 103 mg/dL — ABNORMAL HIGH (ref 70–99)
Glucose-Capillary: 105 mg/dL — ABNORMAL HIGH (ref 70–99)
Glucose-Capillary: 110 mg/dL — ABNORMAL HIGH (ref 70–99)
Glucose-Capillary: 111 mg/dL — ABNORMAL HIGH (ref 70–99)
Glucose-Capillary: 115 mg/dL — ABNORMAL HIGH (ref 70–99)
Glucose-Capillary: 118 mg/dL — ABNORMAL HIGH (ref 70–99)
Glucose-Capillary: 121 mg/dL — ABNORMAL HIGH (ref 70–99)
Glucose-Capillary: 123 mg/dL — ABNORMAL HIGH (ref 70–99)
Glucose-Capillary: 123 mg/dL — ABNORMAL HIGH (ref 70–99)
Glucose-Capillary: 124 mg/dL — ABNORMAL HIGH (ref 70–99)
Glucose-Capillary: 128 mg/dL — ABNORMAL HIGH (ref 70–99)
Glucose-Capillary: 130 mg/dL — ABNORMAL HIGH (ref 70–99)
Glucose-Capillary: 130 mg/dL — ABNORMAL HIGH (ref 70–99)
Glucose-Capillary: 145 mg/dL — ABNORMAL HIGH (ref 70–99)
Glucose-Capillary: 150 mg/dL — ABNORMAL HIGH (ref 70–99)
Glucose-Capillary: 91 mg/dL (ref 70–99)
Glucose-Capillary: 91 mg/dL (ref 70–99)
Glucose-Capillary: 95 mg/dL (ref 70–99)
Glucose-Capillary: 98 mg/dL (ref 70–99)

## 2010-05-20 LAB — DIFFERENTIAL
Basophils Absolute: 0.1 10*3/uL (ref 0.0–0.1)
Basophils Relative: 0 % (ref 0–1)
Eosinophils Absolute: 0 10*3/uL (ref 0.0–0.7)
Eosinophils Absolute: 1 10*3/uL — ABNORMAL HIGH (ref 0.0–0.7)
Eosinophils Relative: 0 % (ref 0–5)
Eosinophils Relative: 6 % — ABNORMAL HIGH (ref 0–5)
Lymphocytes Relative: 26 % (ref 12–46)
Lymphs Abs: 2.4 10*3/uL (ref 0.7–4.0)
Lymphs Abs: 4.7 10*3/uL — ABNORMAL HIGH (ref 0.7–4.0)
Monocytes Absolute: 1.5 10*3/uL — ABNORMAL HIGH (ref 0.1–1.0)
Monocytes Relative: 7 % (ref 3–12)
Neutrophils Relative %: 59 % (ref 43–77)
Neutrophils Relative %: 81 % — ABNORMAL HIGH (ref 43–77)

## 2010-05-20 LAB — TRIGLYCERIDES: Triglycerides: 217 mg/dL — ABNORMAL HIGH (ref <150)

## 2010-05-20 LAB — CULTURE, RESPIRATORY W GRAM STAIN

## 2010-05-20 LAB — CK TOTAL AND CKMB (NOT AT ARMC)
CK, MB: 0.7 ng/mL (ref 0.3–4.0)
Relative Index: 0 (ref 0.0–2.5)

## 2010-05-20 LAB — BRAIN NATRIURETIC PEPTIDE: Pro B Natriuretic peptide (BNP): 72 pg/mL (ref 0.0–100.0)

## 2010-05-20 LAB — BLOOD GAS, ARTERIAL
Acid-Base Excess: 5.1 mmol/L — ABNORMAL HIGH (ref 0.0–2.0)
Bicarbonate: 30 mEq/L — ABNORMAL HIGH (ref 20.0–24.0)
FIO2: 0.4 %
MECHVT: 410 mL
O2 Saturation: 97.4 %
O2 Saturation: 98.6 %
PEEP: 5 cmH2O
PEEP: 5 cmH2O
Patient temperature: 98.6
RATE: 20 resp/min
TCO2: 31.2 mmol/L (ref 0–100)
pO2, Arterial: 116 mmHg — ABNORMAL HIGH (ref 80.0–100.0)
pO2, Arterial: 82.2 mmHg (ref 80.0–100.0)

## 2010-05-20 LAB — PROTIME-INR: INR: 1 (ref 0.00–1.49)

## 2010-05-20 LAB — CARDIAC PANEL(CRET KIN+CKTOT+MB+TROPI)
CK, MB: 0.7 ng/mL (ref 0.3–4.0)
CK, MB: 20.3 ng/mL (ref 0.3–4.0)
CK, MB: 35.1 ng/mL (ref 0.3–4.0)
Relative Index: 0.2 (ref 0.0–2.5)
Relative Index: 0.2 (ref 0.0–2.5)
Relative Index: 0.4 (ref 0.0–2.5)
Relative Index: 0.6 (ref 0.0–2.5)
Total CK: 418 U/L — ABNORMAL HIGH (ref 7–232)
Total CK: 4575 U/L — ABNORMAL HIGH (ref 7–232)
Total CK: 5463 U/L — ABNORMAL HIGH (ref 7–232)

## 2010-05-20 LAB — POCT I-STAT, CHEM 8
BUN: 13 mg/dL (ref 6–23)
Calcium, Ion: 1.04 mmol/L — ABNORMAL LOW (ref 1.12–1.32)
Glucose, Bld: 141 mg/dL — ABNORMAL HIGH (ref 70–99)
HCT: 57 % — ABNORMAL HIGH (ref 39.0–52.0)
TCO2: 26 mmol/L (ref 0–100)

## 2010-05-20 LAB — APTT: aPTT: 28 seconds (ref 24–37)

## 2010-05-20 LAB — BASIC METABOLIC PANEL WITH GFR
Calcium: 7.9 mg/dL — ABNORMAL LOW (ref 8.4–10.5)
Creatinine, Ser: 0.87 mg/dL (ref 0.4–1.5)
GFR calc Af Amer: >60 mL/min (ref >60)
GFR calc non Af Amer: >60 mL/min (ref >60)
Sodium: 135 meq/L (ref 135–145)

## 2010-05-20 LAB — CULTURE, BAL-QUANTITATIVE W GRAM STAIN

## 2010-05-20 LAB — URINE CULTURE

## 2010-05-20 LAB — MRSA PCR SCREENING: MRSA by PCR: NEGATIVE

## 2010-05-20 LAB — MAGNESIUM
Magnesium: 2 mg/dL (ref 1.5–2.5)
Magnesium: 2.1 mg/dL (ref 1.5–2.5)
Magnesium: 2.1 mg/dL (ref 1.5–2.5)

## 2010-05-20 LAB — LACTIC ACID, PLASMA: Lactic Acid, Venous: 3.1 mmol/L — ABNORMAL HIGH (ref 0.5–2.2)

## 2010-05-20 LAB — CHOLESTEROL, TOTAL: Cholesterol: 148 mg/dL (ref 0–200)

## 2010-06-02 LAB — DIFFERENTIAL
Basophils Absolute: 0.2 10*3/uL — ABNORMAL HIGH (ref 0.0–0.1)
Basophils Relative: 2 % — ABNORMAL HIGH (ref 0–1)
Eosinophils Relative: 3 % (ref 0–5)
Monocytes Absolute: 0.8 10*3/uL (ref 0.1–1.0)
Neutro Abs: 5.3 10*3/uL (ref 1.7–7.7)

## 2010-06-02 LAB — COMPREHENSIVE METABOLIC PANEL
Albumin: 3.9 g/dL (ref 3.5–5.2)
Alkaline Phosphatase: 58 U/L (ref 39–117)
BUN: 10 mg/dL (ref 6–23)
CO2: 23 mEq/L (ref 19–32)
Chloride: 103 mEq/L (ref 96–112)
GFR calc non Af Amer: >60 mL/min (ref >60)
Potassium: 4 mEq/L (ref 3.5–5.1)
Total Bilirubin: 0.6 mg/dL (ref 0.3–1.2)

## 2010-06-02 LAB — POCT I-STAT, CHEM 8
Calcium, Ion: 1.11 mmol/L — ABNORMAL LOW (ref 1.12–1.32)
Chloride: 102 mEq/L (ref 96–112)
HCT: 49 % (ref 39.0–52.0)
Sodium: 135 mEq/L (ref 135–145)
TCO2: 23 mmol/L (ref 0–100)

## 2010-06-02 LAB — CBC
HCT: 47.2 % (ref 39.0–52.0)
Hemoglobin: 16.1 g/dL (ref 13.0–17.0)
MCV: 97.9 fL (ref 78.0–100.0)
Platelets: 385 10*3/uL (ref 150–400)
RBC: 4.82 MIL/uL (ref 4.22–5.81)
WBC: 12 10*3/uL — ABNORMAL HIGH (ref 4.0–10.5)

## 2010-06-02 LAB — POCT CARDIAC MARKERS

## 2010-06-25 IMAGING — CR DG CHEST 1V PORT
1 series · 1 of 1 positions shown · non-contrast
Comparison: Chest radiograph performed 12/23/2008, and CT of the
chest performed 03/11/2009

CLINICAL DATA: Endotracheal tube insertion; patient found
unresponsive.

PORTABLE CHEST - 1 VIEW

[AP]
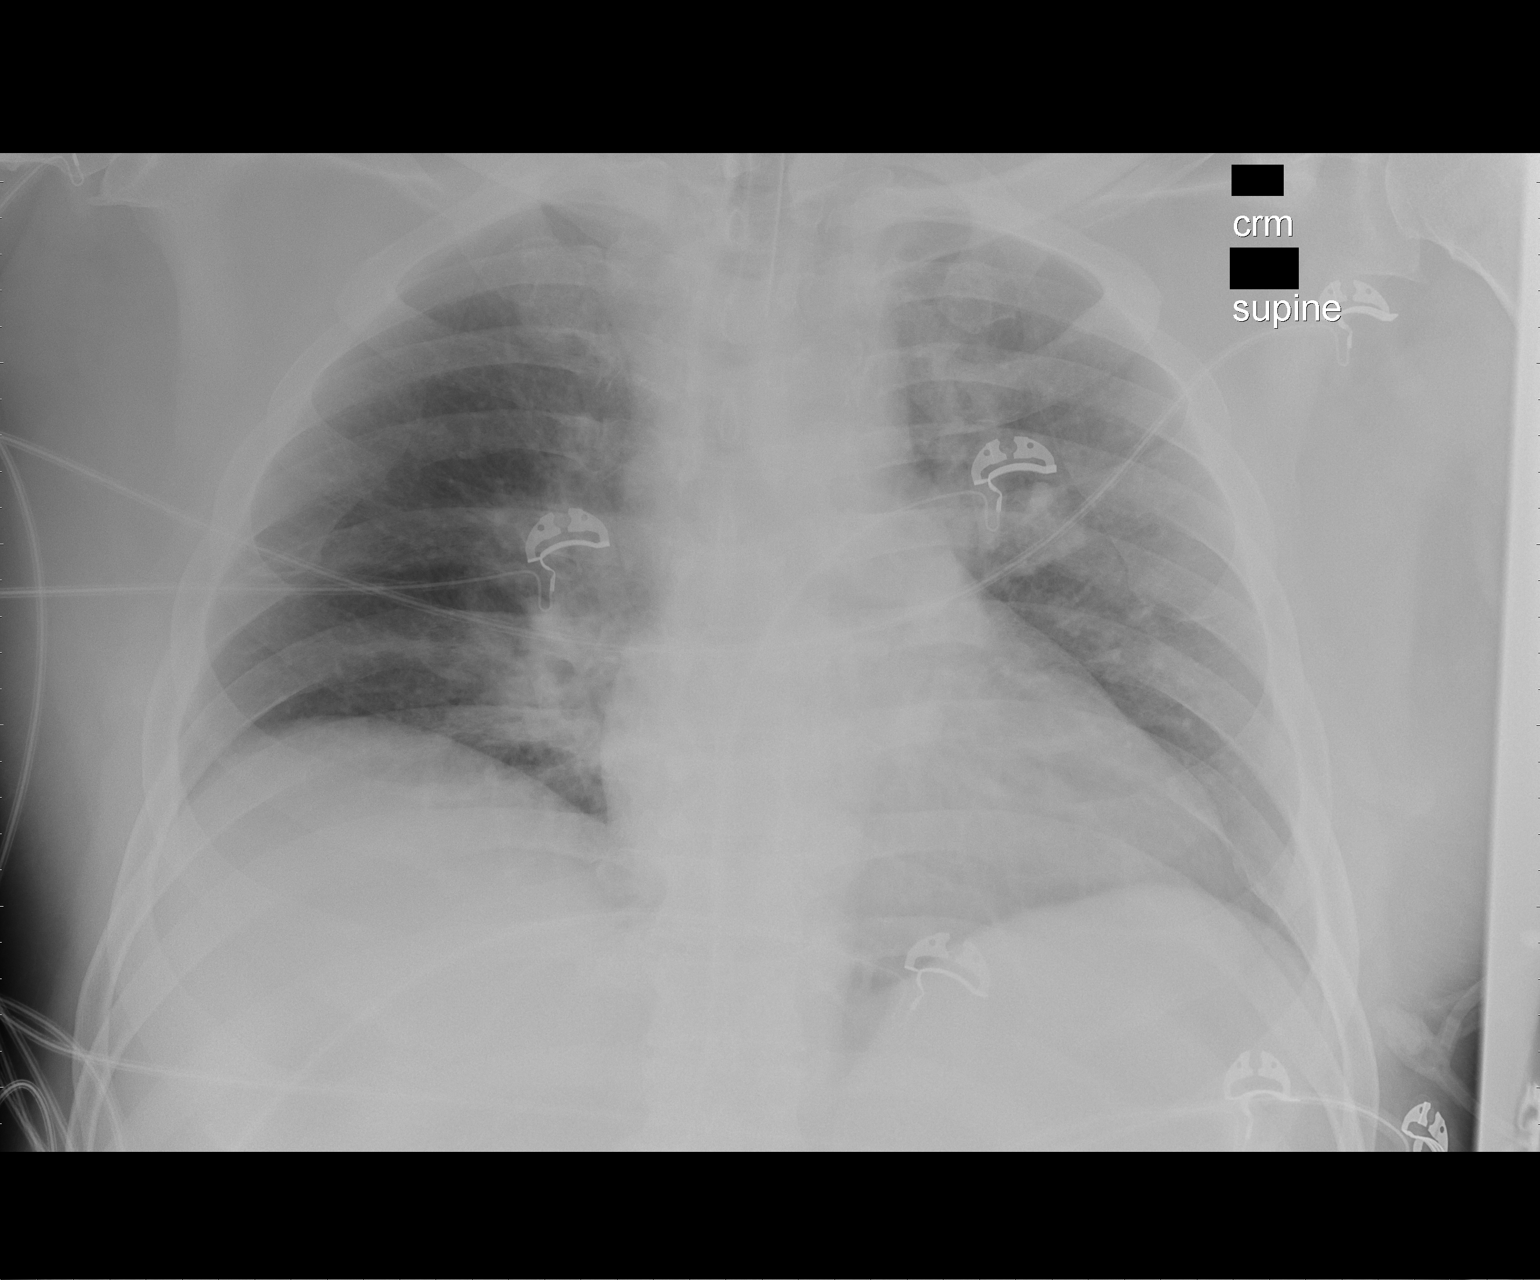

[1 of 1 positions shown; findings below may reference images not displayed]

FINDINGS: The patient's endotracheal tube is seen ending 4-5 cm
above the carina.

The lungs are hypoexpanded; mild vascular crowding is noted.
Medial right basilar opacity may reflect atelectasis or pneumonia.
Mild left-sided atelectasis is noted.  No pleural effusion or
pneumothorax is seen.

The cardiomediastinal silhouette is within normal limits, given
technique.  No acute osseous abnormalities are identified.
IMPRESSION: 1.  Endotracheal tube seen ending 4-5 cm above the carina.
2.  Hypoexpanded lungs; medial right basilar opacity may reflect
atelectasis or pneumonia.  Mild left-sided atelectasis noted.

## 2010-06-27 IMAGING — CR DG CHEST 1V PORT
1 series · 1 of 1 positions shown · non-contrast
Comparison: 04/05/2009

CLINICAL DATA: Respiratory failure.

PORTABLE CHEST - 1 VIEW

[AP]
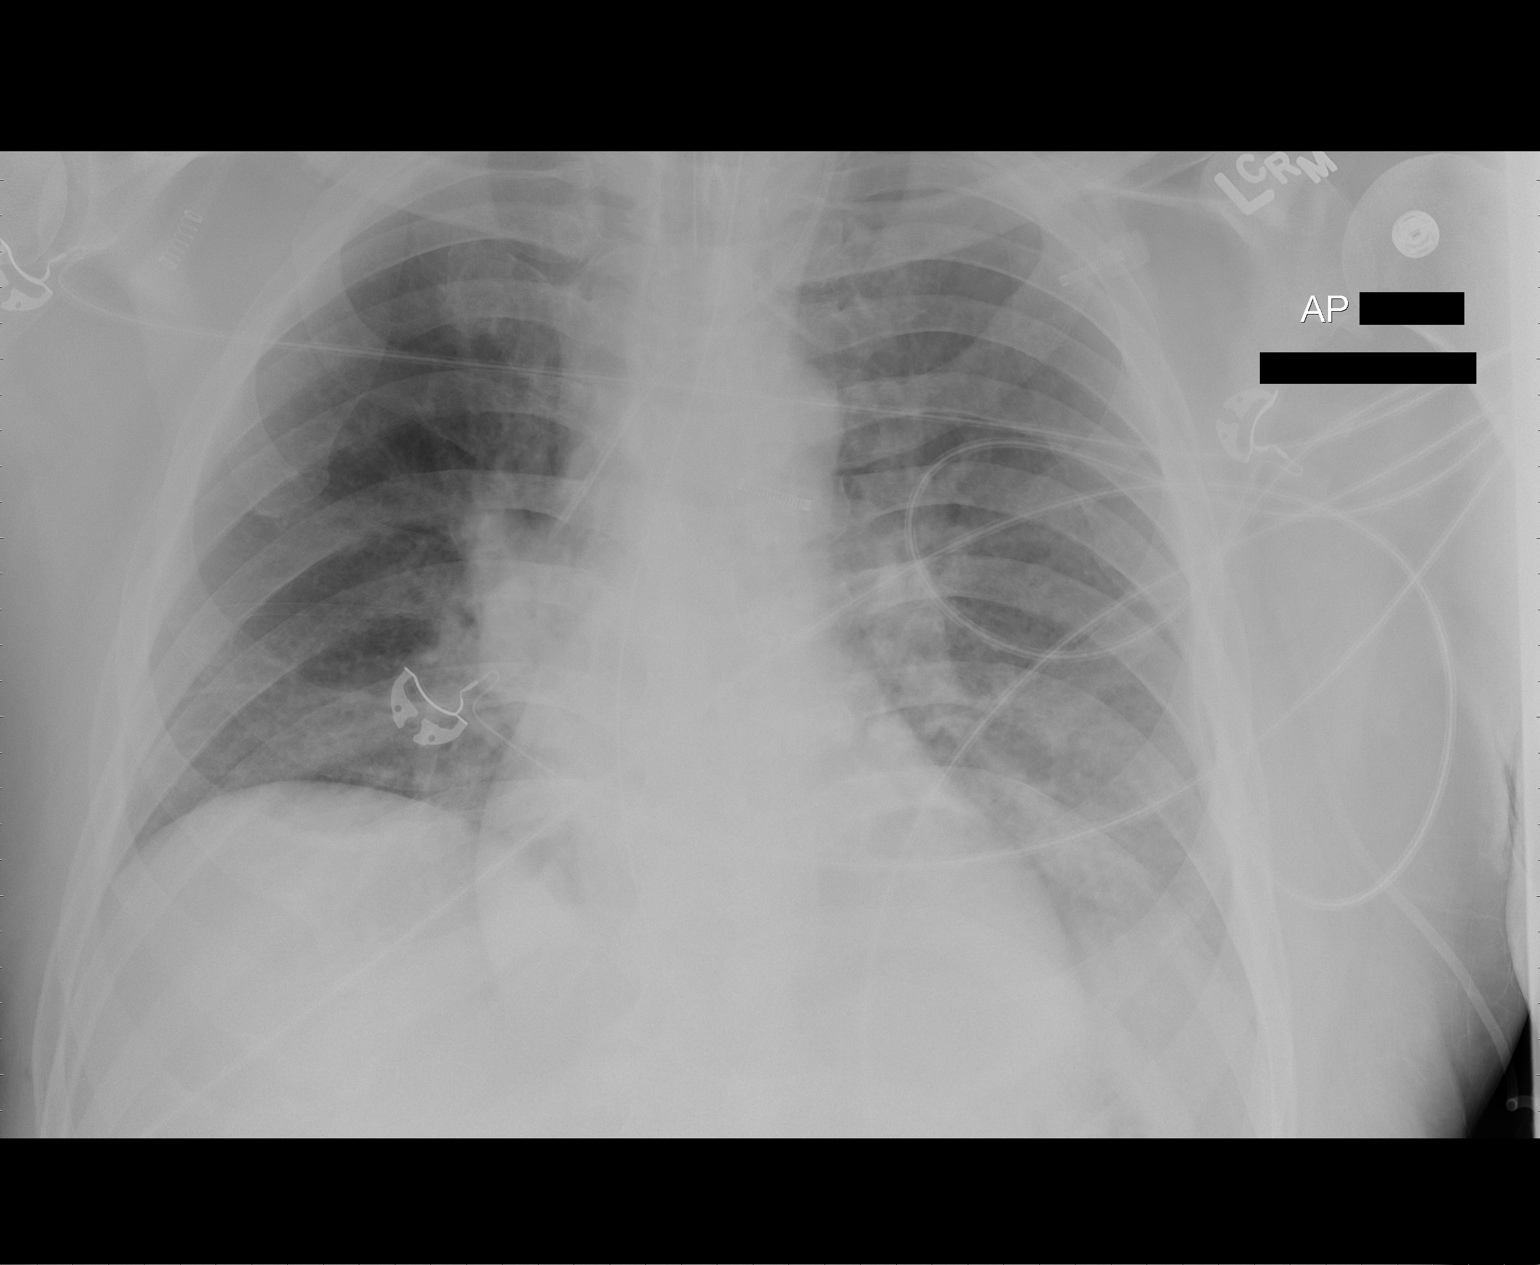

[1 of 1 positions shown; findings below may reference images not displayed]

FINDINGS: The endotracheal tube and left IJ catheters are stable.
There is a new NG tube coursing down the esophagus and into the
stomach.  The heart lungs are stable.  Persistent areas of
atelectasis and probable small patchy infiltrates.  No pleural
effusions.
IMPRESSION: 1.  Support apparatus in good position.
2.  Persistent streaky areas of atelectasis and possible small
infiltrates.

## 2010-06-30 IMAGING — CR DG CHEST 1V PORT
1 series · 1 of 1 positions shown · non-contrast
Comparison: Portable chest x-ray of 04/09/2009

CLINICAL DATA: Respiratory distress, overdose

PORTABLE CHEST - 1 VIEW

[view not recorded]
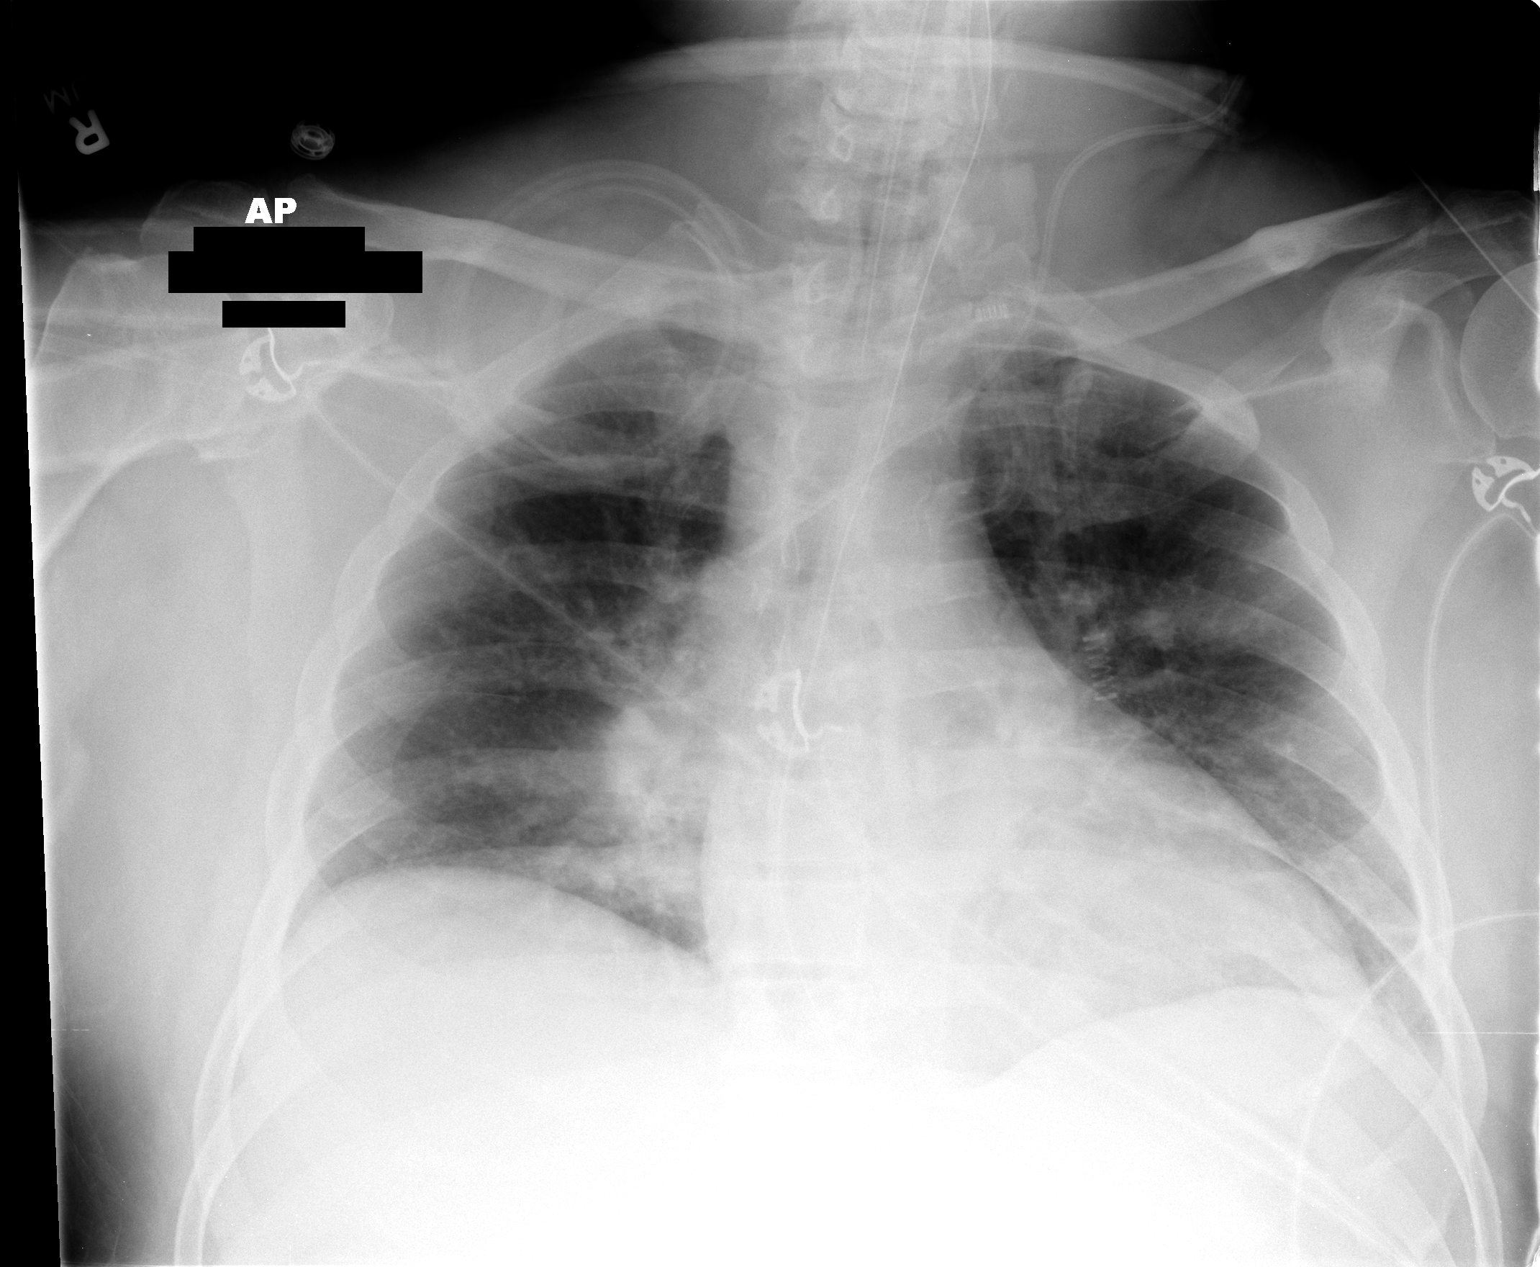

[1 of 1 positions shown; findings below may reference images not displayed]

FINDINGS: The lungs are better aerated with decreasing basilar
atelectasis.  Mild cardiomegaly is stable.  There may be mild
pulmonary vascular congestion remaining.  Endotracheal tube and
left IJ central venous catheter are unchanged in position, and an
NG tube remains.
IMPRESSION: Improved aeration with decreasing basilar opacities.  Cardiomegaly
is present with perhaps minimal pulmonary vascular congestion.

## 2010-07-12 NOTE — Discharge Summary (Signed)
NAME:  [name], [name]               ACCOUNT NO.:  [id number]   MEDICAL RECORD NO.:  [id number]          PATIENT TYPE:  INP   LOCATION:  1508                         FACILITY:  [facility]   PHYSICIAN:  Beckey Rutter, MD  DATE OF BIRTH:  01/10/1956   DATE OF ADMISSION:  10/01/2007  DATE OF DISCHARGE:                               DISCHARGE SUMMARY   PRIMARY CARE PHYSICIAN:  This patient is unassigned.   HISTORY OF PRESENT ILLNESS/CHIEF COMPLAINT:  A 55 year old gentlemanman  multiple medical problem presented to the emergency department on October 05, 2007, with nausea, vomiting and diarrhea and muscle aches and  generalized aches and pain.   HOSPITAL STAY:  1. Generalized aches and pain.  Patient is ruled out for H1N1.  2. Patient required pain medication for this aching and pain during      the hospital stay.  I suspect that the patient has low tolerance      for pain together with possible drug-seeking behavior.  3. Wheezing and chest pain and excessive yellow copious sputum.  This      is fault secondary to COPD with bronchiolitis.  Patient was      continued on antibiotic and recently his intravenous steroid was      changed to p.o. steroid.  Currently his breathing is improving.  4. Abdominal pain and nausea which is presenting symptoms remained      stable with essentially negative CAT scan as below.  5. Diabetes.  Patient was continued on a sliding scale with fair      glycemic control.  6. Hypertension.  Patient was started on antihypertensive medication;      lisinopril/hydrochlorothiazide.  We will continue to monitor blood pressure control.  1. Peripheral neuropathy.  This is associated with generalized aches      and pains.  The patient is on Neurontin.   DISCHARGE DIAGNOSES:  1. Generalized aches and pain.  2. Patient rule out for H1N1.  3. Chronic obstructive pulmonary disease with acute exacerbation.  4. History of polysubstance abuse.  5. History of hepatitis B  virus, hepatitis D virus.  6. 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  7. Fibromyalgia and chronic pain.  8. Medical noncompliance.  9. History of paroxysmal atrial fibrillation 2009.  10.Diabetes.  11.Hypertension.  12.Hyperlipidemia.  13.Chronic obstructive pulmonary disease with acute exacerbation as      discussed above.   HOSPITAL PROCEDURES:  1. Chest x-ray on October 01, 2007.  Impression:  Possible bronchitis,      no infiltrate.  Chest x-ray without contrast on October 01, 2007, showing no sign of  developing mass, 3-4 mm density in the right middle lobe appears the  same.  Chest radiographic finding must have been due to overlapping  shadows.  Chest x-ray on October 02, 2007, showing a slight increase in prominence  of interstitial markings.  This could reflect interstitial pneumonia.  No consolidation or collapse.  1. On October 02, 2007, the patient had CT abdomen without contrast to      evaluate for the nausea and nonspecific abdominal pain.  Impression  was showing upper abdominal lymphadenopathies nonspecific and      appears similar compared to the CT of March 2009.  In the absence      of nonmalignancy, this is most likely inflammatory in nature.  This      appearance can be seen in the setting of chronic inflammatory such      ashepatitis.  Absence of the spleen, 1.3 cm soft tissue nodule in the left upper  quadrant may represent a accessory spleen and splenule.  1. No acute finding is identified in the abdomen.   DISCHARGE MEDICATIONS:  Dictated on the day of actual discharge.   PLAN OF CARE NOW:  Patient is approaching stability.  His chest  examination seems stable with very minimal wheezes currently.  The  amount of sputum production also reduced to very minimal.  I suspect the  patient will need to continue on antibiotic and steroid to be tapered  off.  I told [name] that if he remained stable the way he is, we  will discharge him within a day or  2.   Time spent on discharge, discussion with the patient and assessment is  more than 30 minutes.      Beckey Rutter, MD  Electronically Signed     EME/MEDQ  D:  10/08/2007  T:  10/08/2007  Job:  [id number]

## 2010-07-12 NOTE — Discharge Summary (Signed)
NAME:  [name], [name]               ACCOUNT NO.:  [id number]   MEDICAL RECORD NO.:  [id number]          PATIENT TYPE:  INP   LOCATION:  1508                         [facility]ACILITY[facility]  [facility]   PHYSICIAN:  Thomasenia Bottoms, MDDATE OF BIRTH:  01/23/1956   DATE OF ADMISSION:  10/01/2007  DATE OF DISCHARGE:  10/10/2007                               DISCHARGE SUMMARY   SUMMARY OF HOSPITAL COURSE:  1. Mr. Voong is a 55 year old gentleman with multiple medical      problems who presented to the emergency department with nausea,      vomiting, diarrhea and abdominal pain which he now tells me that he      is mostly having the chronic pain that he has in his joints with a      little bit of abdominal pain.  He was admitted evaluated for the      abdominal pain.  It was felt to be benign.  No clear cause was      determined.  He did rule out for H1N1 virus.  The patient also had      a COPD flare and acute bronchitis.  He was treated with steroids,      nebulizers.  His wheezing completely resolved by the morning of      discharge.  He is ambulating in the halls with no assistance and      not requiring O2.  His room air O2 saturations are in the upper      90s, and his respiratory status is now stable.  The patient also      developed what is likely mild thrush.  I see he has a history of      angioedema secondary to candida.  He reports mouth pain and      rawness of his mouth and throat for a year now.  He has been      started on Diflucan.  He says that in addition to the rawness he      has a lot of pain in his throat, and he thinks he had may have      cancer.  He does have some mild lymphadenopathy.  No other discrete      masses.  Initially we were going to have an ENT inpatient      evaluation, but the patient feels like this is fine and chronic and      that he can see someone in the outpatient setting, and I agree      since the discomfort has been going on for a year, and he does  appear to have some thrush.  The plan is to treat him with Diflucan      for 10 days, and he in the meantime will make an outpatient ENT      follow-up appointment; the phone number has been given.  He also is      aware that the primary doctor listed on his Medicaid card is the      one who will have to make the outpatient referral.  He has been  instructed rinsing his mouth with Advair after use.  I actually      talkeed to the patient stopping the Advair.  He did not want to do      this.  He says it helps breathing and that he has been on it for      years, so we will quickly wean his prednisone p.o. and make sure he      rinses his mouth with Advair and continue the Diflucan for now      until he is seen by ENT.  2. Diabetes.  The patient was monitored.  Was kept on a sliding scale      insulin while he was here.  His blood sugars will likely improve as      he comes off the prednisone.   Please see the discharge summary dated October 01, 2007, for a list of the  patient's procedures.  Of note, he did have a negative CT angiogram of  the chest while he was here.   DISCHARGE DIAGNOSES:  1. Chronic obstructive pulmonary disease flare which resolved.  2. Chronic pain and history of narcotic use for this.  3. History of polysubstance abuse including cocaine, IV drugs and      methadone.  4. History of hepatitis C and hepatitis B.  5. 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  6. History of fibromyalgia.  7. History of medical noncompliance.  8. History of paroxysmal atrial fibrillation.  9. History of coronary artery disease.  He had catheterization in      2003.  10.Diabetes mellitus type 2.  11.Hypertension.  12.Hyperlipidemia.  13.Gastroesophageal reflux disease.  14.Osteoarthritis.  15.Thrush.   MEDICATIONS:  At the time of discharge include:  1. Prednisone 20 mg p.o. daily for 3 days, then 10 mg daily for 3      days, then stop.  2. Diflucan 100 mg p.o. daily for 10  days.  He did receive a dose of      200 mg in the hospital prior to discharge.  3. Percocet 10/325 1 tablet q.6h. p.r.n. pain #30 tablets are being      prescribed.  4. Neurontin 100 mg 4 tablets every morning, 3 tablets every day at      noon, and 4 tablets every night, no refills, 1 month supply given.  5. Spiriva 18 mcg 1 puff daily.  6. Albuterol MDI with spacer 2 puffs q.i.d. for 14 days, then 2 puffs      t.i.d. p.r.n.  7. BuSpar 15 mg 2 tablets twice daily.  8. Amitriptyline 50 mg 3 tablets at bedtime.  9. Celebrex 200 mg daily.  10.Metformin extended release 500 mg 2 tablets every morning.  11.Lipitor 40 mg at bedtime.  12.Protonix 40 mg every morning.  13.Lisinopril/hydrochlorothiazide 10/12.5 mg daily.  14.Aspirin 81 mg daily.  15.Tramadol 50 mg 2 tablets 3 times daily.  16.Advair 250/50 one puff daily.   The patient has instructions to follow up with ENT.  He has chosen a new  primary care physician.  Previously, he was seen at [facility], Dr.  Reche Dixon, and he will follow with Dr. Concepcion Elk.  The patient already has  the doctor's card and is in the process of changing his Medicaid card  such that [facility] will become his primary doctor.  Greater than 30  minutes was spent during this discharge.      Thomasenia Bottoms, MD  Electronically Signed     CVC/MEDQ  D:  10/10/2007  T:  10/10/2007  Job:  [id number]   cc:   Fleet Contras, M.D.  Fax: [phone]   [facility]   [facility]  Fax: [phone]

## 2010-07-12 NOTE — Discharge Summary (Signed)
NAME:  [name], [name] NO.:  [id number]   MEDICAL RECORD NO.:  [id number]          PATIENT TYPE:  OBV   LOCATION:  4740                         FACILITY:  [facility]   PHYSICIAN:  Pearlean Brownie, M.D.DATE OF BIRTH:  1955-03-03   DATE OF ADMISSION:  02/14/2007  DATE OF DISCHARGE:  02/15/2007                               DISCHARGE SUMMARY   PRIMARY CARE PHYSICIAN:  Donia Guiles, M.D., [facility].   DISCHARGE DIAGNOSES:  1. Chest pain, likely due to costochondritis/gastroesophageal reflux      disease.  2. Left sided weakness with no clear etiology.  3. Coronary artery disease, history of catheterization in 2007, 2      vessel disease.  4. Hypertension.  5. Hyperlipidemia.  6. Diabetes.  7. Severe osteoarthritis.  8. Hepatitis C.  9. Anxiety.  10.Psychosis.  11.Swollen tongue.  12.History of polysubstance abuse.  13.Gastroesophageal reflux disease.  14.History of tobacco abuse.   DISCHARGE MEDICATIONS:  1. Hydrochlorothiazide 25 mg p.o. daily.  2. Protonix 40 mg p.o. daily.  3. Aspirin 81 mg p.o. daily.  4. Elavil 150 mg p.o. daily.  5. Zocor 40 mg p.o. daily.  6. Advair 250/50 1 puff twice daily.  7. Benadryl 25 mg p.o. q.6 hours as needed for tongue swelling.  8. Celebrex 200 mg p.o. daily.  9. Percocet 5/325 1 tablet q.6 hours p.r.n. pain.   CONSULTATIONS:  None.   PROCEDURES:  The patient underwent a head CT on February 14, 2007 that  conveyed sinusitis but no acute intracranial findings, no hemorrhage, no  infarct, and no masses.   LABORATORY DATA:  Upon admission, the patient had sodium level of 137,  potassium 4, chloride 107, bicarb 26.5, BUN 16, creatinine 1.1, glucose  91. Point of care cardiac enzymes showed CK MB of 1.2, troponin of less  than 0.05, and negative D-dimer. While patient was hospitalized, cardiac  enzymes were negative x3. Urinalysis was negative. Hemoglobin A1C was  6.3. The patient had a cholesterol of 221,  triglycerides of 331, HDL of  30, and LDL of 125. On day of discharge, hemoglobin 6.3. White blood  cell count 14.7. Platelets 334,000, potassium 3.8, sodium 138,  creatinine 1.17 and glucose of 138.   EKG showed no significant changes. Repeat EKG in the morning showed no  changes from prior EKG's and was normal sinus rhythm.   Chest x-ray showed hyper-inflation with flattened diaphragms, consistent  with COPD. Leg x-ray of the left knee showed degenerative joint disease.   HOSPITAL COURSE BY PROBLEM:  This is a 55 year old homeless Caucasian  male that was admitted for chest pain, rule out myocardial infarction.  1. CHEST PAIN:  This was not thought to be cardiac in nature, given      his negative cardiac enzymes. The patient's EKG's showed no      significant changes from previous EKG's done 1 year previously. D-      dimer was negative. Was not consistent with PE or aorto-dissection.      The patient continued to complain of chest pain throughout his      hospital  stay. Very subjective findings and no objective findings.      He did complain of chest pain with deep palpation of the chest,      very consistent with costochondritis. Therefore, costochondritis      was at the top of the differential. He also has a history of      gastroesophageal reflux disease. So, gastroesophageal reflux      disease is also at the top of the differential, so this could be a      mixed picture. He was treated with Protonix during his      hospitalization for that gastroesophageal reflux disease, that      might be related to his chest pain. He was on oxygen for a short      period of time, although off oxygen, he was 96% on room air. The      patient seemed to be malingering, focusing on wanting pain      medications and not appearing very honest about his chest pain. His      chest x-ray showed no cardiac abnormalities. Instead, was      consistent with COPD. The patient was continued on his home  dose of      aspirin and was followed on telemetry for 24 hours. The patient had      no abnormalities on telemetry and ruled out for myocardial      infarction.  2. LEFT SIDED NUMBNESS:  The patient had a head CT that was negative      for any hemorrhages, infarct, or masses. Was only consistent with      sinusitis. The patient had no focal deficits on examination.      Cranial nerves 2-12 were grossly intact on examination. No further      treatment was performed and no further workup was needed.  3. HYPERTENSION:  The patient's blood pressures systolic ranged from      140's to 150's. Diastolic 80's to 60'A. The patient was continued      on his hydrochlorothiazide and will be discharged on the same      medication.  4. DIABETES TYPE 2:  The patient has been diet controlled. His A1C was      6.3%. No changes to his medication list.  5. OSTEOARTHRITIS/CHRONIC PAIN:  The patient had a left knee x-ray      that was consistent with moderate degenerative joint disease of the      left knee. He was continued on his Celebrex and discharged with      prescription for Celebrex. He was instructed that we would not      treat his chronic pain. That was something that needs to be handled      by his primary care physician and he seemed to understand that. We      will give him a very short amount of pain medication of Percocet,      for no more than 4 days.  6. QUESTIONABLE ANGIO-EDEMA:  The patient has a swollen tongue. There      are no allergies noted and no triggers mentioned. He was seen by      ENT in the past and thought to have Candida and was treated with      Benadryl for the swollen tongue. The patient had no signs or      symptoms of Candida upon this admission and was continued on his      Benadryl. There are  no medications that he is currently taking and      that is consistent with angio-edema.  7. CHRONIC OBSTRUCTIVE PULMONARY DISEASE:  The patient denies any      oxygen  requirement at home. He does smoke several cigarettes a day.      He was treated with albuterol nebs as needed for wheezing. He will      be continued on his Advair upon discharge, although it was held      during admission due to chest pain. He was counseled on smoking      cessation.  8. PSYCHOSIS:  He did convey some paranoia upon admission but that did      not persist. He did not have active visual or auditory      hallucinations during his hospital stay. We will not write him a      prescription for any anti-psychotics or any anti-depressants at      this time. That will be handled by his primary care physician.  9. HISTORY OF POLY-SUBSTANCE ABUSE:  The patient may still have some      substance abuse with narcotics. We are very hesitant to give him      any pain medications, given his persistence in wanting pain      medications and his history. He was seen by social work during the      hospital stay. He needs to be followed up closely as an outpatient.  10.HYPERLIPIDEMIA:  The patient had a fasting lipid panel performed      during his hospitalization. Showed that he did indeed have      uncontrolled hyperlipidemia. He is currently on Zocor and will      continue on that and be followed up as an outpatient.   DIET:  He is to be on a low-sodium, heart healthy diet.   ACTIVITY:  He has no restrictions regarding his activity.   SPECIAL INSTRUCTIONS:  He is to call [facility] if the chest pain is  10/10, not improving with rest, and radiation to the neck or back. But  if no one answers, he needs to go straight to the emergency room.   FOLLOWUP:  He is to followup with Dr. Reche Dixon at [facility], phone  number [phone], on January 62m, 2009 at 11:30 a.m.   CONDITION ON DISCHARGE:  Stable.      Marisue Ivan, MD  Electronically Signed      Pearlean Brownie, M.D.  Electronically Signed    KL/MEDQ  D:  02/15/2007  T:  02/16/2007  Job:  [id number]   cc:   Dineen Kid.  Reche Dixon, M.D.

## 2010-07-12 NOTE — H&P (Signed)
NAME:  [name], [name] NO.:  [id number]   MEDICAL RECORD NO.:  [id number]          PATIENT TYPE:  INP   LOCATION:  1508                         FACILITY:  [facility]   PHYSICIAN:  Vanetta Mulders, MD         DATE OF BIRTH:  1955-10-26   DATE OF ADMISSION:  10/01/2007  DATE OF DISCHARGE:                              HISTORY & PHYSICAL   HISTORY OF PRESENT ILLNESS:  This is a 55 year old gentleman with past  medical history significant for coronary artery disease, hypertension,  hyperlipidemia, diabetes and hepatitis who presented to the Emergency  Department today with three days of nausea, vomiting, diarrhea, muscle  aches, basically hurting all over, cough productive of yellow sputum,  sore throat, fever, chills, nasal congestion, increased shortness of  breath and wheezing.  Diarrhea resolved spontaneously a day prior to  admission.  The rest of the symptoms persisted throughout and brought  him to the Emergency Department.  The patient denies hematemesis,  hematochezia or melena.  He denies sick contacts.  He is a very poor  historian but he states that he vomited at least ten times the day of  admission, clear yellow vomit.  His stool was watery, loose brown when  he had the diarrhea. He does not remember how many times a day or if he  saw any blood but he thinks he did not.  His abdominal pain is more  pronounced in the left upper quadrant but on further inquiring he states  that he hurts all over.   PAST MEDICAL HISTORY:  1. Coronary artery disease status post catheterization in 2003, shows      circ of 50% proximal stenosis and 30% mid lesion.  Right coronary      artery 100% stenosis and ejection fraction of 60%.  2. Hypertension.  3. Hyperlipidemia.  4. Diabetes.  5. History of polysubstance abuse.  The patient states that he has      been clean for three years.  He used to do IV drugs, cocaine,      methadone, etc.  6. HBV-HDV.  7. 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  8. Osteoarthritis.  9. Fibromyalgia and chronic pain.  10.Gastroesophageal reflux disease.  11.History of medication noncompliance.  12.History of paroxysmal atrial fibrillation in April 2009.   MEDICATION AT HOME:  The patient does not remember what medications he  is taking currently.  He was taking BuSpar, Amitriptyline, Neurontin and  Celebrex.   ALLERGIES:  The patient is allergic to MORPHINE.   PHYSICAL EXAMINATION:  VITAL SIGNS:  Temperature 100, blood pressure  133/69, heart rate 94, respiratory rate 20, oxygen saturation 97.  GENERAL:  The patient is in no acute distress but coughing and being  nauseated during the interview.  HEAD:  Normocephalic and atraumatic.  Eyes:  Pupils are equal, round and  reactive to light and accommodation.  Extraocular movements intact.  Oropharynx clear.  No erythema, no exudate.  Edentulous.  Mallampati 4.  Normal lucency of tympanic membrane with clear ear canals.  NECK:  Tender to palpation cervical lateral area  and small lymph node  sub 1 cm on the left submandibular region.  CARDIOVASCULAR:  Tachycardia, regular.  RESPIRATORY:  Decreased air movements, increased expiration, diffuse  wheezing.  ABDOMEN:  Bowel sounds positive.  Tender to palpation throughout.  No  guarding, no rebound.  EXTREMITIES:  No edema.  NEUROLOGIC:  Nonfocal.  PSYCHOLOGICAL:  Agitated.   LABORATORY DATA:  Sodium 138, potassium 3.4, chloride 102, bicarbonate  25, BUN 9, creatinine 1.1, glucose 99, ionized calcium 1.13.  White  blood cell count 18.9, hemoglobin 17.5, platelets 346.  CT chest no  acute disease.  Chest x-ray:  Right lower lobe nodule.   SOCIAL HISTORY:  The patient is still smoking one pack per week.  He  used to smoke three packs per day and he started when he was very young.  He states that he has not taken any drugs for three years.  He denies  drinking heavily.   FAMILY HISTORY:  Both his mother and father passed away  a while back.  His father has a history of coronary artery disease.  He has one child,  a daughter, that is 46 years old.  She smokes heavily and she was  recently diagnosed with chronic obstructive pulmonary disease.  She has  three grandchildren.   REVIEW OF SYSTEMS:  Negative for other than HPI.  All systems were  reviewed.   ASSESSMENT/PLAN:  1. Nausea and vomiting, diarrhea, sore throat, fever, chills, muscle      cramps.  I highly suspect that the patient has flu.  Given the fact      in the community there is swine flu, I think it is reasonable to      put him in respiratory isolation, droplet precautions.  He is going      to be started on Tamiflu.  He is going to be required to wear a      surgical mask when transported.  2. Wheezing.  The patient has a diagnosis of COPD but I could not find      any recent PFTs.  On exam he has diffuse wheezing and he is      coughing yellow green sputum.  I am going to send sputum for Gram      stain, culture and sensitivity.  The patient had a chest x-ray that      showed a possible right lower lobe infiltrate.  The radiologist      recommended CT for further evaluation.  The CT did not reveal      evidence of any signs of pneumonia.  At this time I am going to      treat him like a COPD exacerbation.  He is going to receive Solu-      Medrol 125 mg IV q.8h., albuterol nebulizers and Spiriva.  He will      receive Tamiflu as well as azithromycin to help with the      inflammation.  3. Abdominal pain, nausea and vomiting.  We will further evaluate with      lipase, LFTs, plain abdominal x-rays and an abdominal ultrasound.      The patient was placed on fluid for rehydration and he is going to      be NPO for studies and also because of nausea and vomiting.  He      will receive supportive medication with Phenergan and Reglan for      nausea.  4. Diabetes.  The patient is going to receive sliding scale  inulin      q.4h. until meals.  5.  Hypertension.  We will hold antihypertensive at this time and we      will rehydrate.  6. History of atrial fibrillation.  We will check EKG.  7. History of smoking.  The patient will receive nicotine patch.  He      is going to have smoking cessation consult.  8. Peripheral neuropathy.  The patient will receive Neurontin.  9. Leukocytosis.  According to previous records, the patient has a      history of chronic leukocytosis with eosinophilia.  He will      probably need further work-up for eosinophilia if persistent on CBC      with differential.  For now blood cultures, UA and chest x-ray will      be checked.  10.Polycythemia.  Most likely secondary to smoking and possibly      chronic hypoxia.  We will continue to monitor      and repeat CBC after rehydration to see what the patient's value      is.  11.GI prophylaxis with Protonix.  12.DVT prophylaxis with Lovenox.      Vanetta Mulders, MD  Electronically Signed     DA/MEDQ  D:  10/02/2007  T:  10/02/2007  Job:  [id number]
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
## 2010-07-15 NOTE — H&P (Signed)
[facility]  Patient:    [name], [name]                      MRN: [id number] Adm. Date:  [id number] Attending:  Geoffery Lyons A Dictator:   Candi Leash. Theressa Stamps, N.P.                   Psychiatric Admission Assessment  DATE OF ADMISSION:  August 27, 2000  PATIENT IDENTIFICATION:  This is a 55 year old married white male voluntarily admitted to [facility] on August 27, 2000, for polysubstance abuse requesting detoxification and psychosis.  HISTORY OF PRESENT ILLNESS:  This information is obtained from the chart.  The patient did not want to talk.  The patient apparently had relapsed on crack approximately two days ago, feeling somewhat depressed and having suicidal ideation with a plan to cut his wrists.  The patient did have a knife on him. The patient was brought to the hospital by a friend.  The patient has also been experiencing auditory hallucinations to harm himself.  He appeared somewhat intoxicated on arrival and has been abusing crack over the last few days.  The patient reports poor sleep, decreased appetite for the past six months with a 25 pound weight loss although it does say the patient is obese. The patient is experiencing positive auditory hallucinations with the devil telling him to cut himself; no visual hallucinations.  It unable to be determined if the patient was still remaining somewhat suicidal.  He reports no auditory hallucinations at this time; it was the only thing the patient would volunteer.  PAST PSYCHIATRIC HISTORY:  The patient was at [facility] June 2001 for alcohol abuse and psychosis and has had several admissions to [facility].  He has a history of cutting his wrists.  SUBSTANCE ABUSE HISTORY:  The patient reports drinking "a couple of beers"; relapsed on crack cocaine, using $400 worth over the weekend; and marijuana, using two joints a day.  PAST MEDICAL HISTORY:  Primary care Allanah Mcfarland is  Dr. Debby Bud.  Medical problems: Coronary artery disease, asthma, hepatitis C, hypertension, and chronic back pain; type 2 diabetes is questionable from last admission.  MEDICATIONS: 1. OxyContin 40 mg b.i.d. 2. Altace 10 mg q.d. 3. Amitriptyline 50 mg q.h.s. 4. Xanax 1 mg t.i.d. prescribed by Dr. Debby Bud at [place].  DRUG ALLERGIES:  No known drug allergies.  PHYSICAL EXAMINATION:  Performed at [facility].  LABORATORY DATA:  SGOT was elevated at 42.  Total protein 8.4.  Urine drug screen was positive for benzodiazepines, THC, and cocaine.  Alcohol level was 13.  Urinalysis: Total protein 30, urobilinogen 1.  SOCIAL HISTORY:  A 55 year old married white male.  He has two children.  This is his third marriage.  He is unemployed due to his chronic pain.  Family history: Mother with depression.  MENTAL STATUS EXAMINATION:  The patient was in bed, very sleepy, difficult to arouse.  He did not want to answer any questions except that he was not hearing any voices.  Speech is normal.  Mood is irritable.  Thought processes: The patient reported no voices, difficult to ascertain any other psychotic symptoms.  Cognitive functioning appears to be intact.  Judgment is poor. Insight is poor.  Poor impulse control.  ADMISSION DIAGNOSES: Axis I:    1. Psychosis, not otherwise specified.            2. Polysubstance abuse. Axis II:  Deferred. Axis III:  1. Coronary artery disease.            2. Valve problems.            3. Arthritis.            4. Hepatitis C. Axis IV:   Moderate with problems relating to occupation and medical problems. Axis V:    Current is 30, estimated past year is 55.  INITIAL PLAN OF CARE:  Voluntary admission to [facility] for suicidal ideation, psychosis, and cocaine abuse.  Contract for safety, check every 15 minutes.  Will resume his Altace and verify other medications and diagnoses with his primary care Lydian Chavous.  Will obtain a  thyroid level.  Plan is to return the patient to his prior living arrangement, to alleviate psychotic symptoms so the patient can be safe, for the patient to attend the [facility] after discharge.  ESTIMATED LENGTH OF STAY:  Four to five days. y DD:  08/27/00 TD:  08/27/00 Job: [id number]
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
## 2010-07-15 NOTE — Cardiovascular Report (Signed)
NAME:  [name], [name]               ACCOUNT NO.:  [id number]   MEDICAL RECORD NO.:  [id number]          PATIENT TYPE:  INP   LOCATION:  3736                         FACILITY:  [facility]   PHYSICIAN:  Veverly Fells. Excell Seltzer, MD  DATE OF BIRTH:  August 19, 1955   DATE OF PROCEDURE:  01/31/2006  DATE OF DISCHARGE:                            CARDIAC CATHETERIZATION   PROCEDURE:  Left heart catheterization, selective coronary angiography,  left ventricular angiography, Starclose of the right femoral artery.   INDICATIONS:  [name] is a 55 year old male with known coronary  artery disease.  He is medically noncompliant and has a history of  polysubstance abuse.  He presented with chest pain and has had no  objective findings of myocardial ischemia.  However, he has continued to  have recurrent chest pain throughout his hospital stay and we elected to  proceed with cardiac catheterization because of his refractory pain.   PROCEDURAL DETAILS:  The risks and indications of the procedure were  explained in detail to the patient.  Informed consent was obtained.  The  right groin was prepped, draped, and anesthetized with 1% lidocaine.  Using a modified Seldinger technique, a 6-French sheath was placed in  the right femoral artery.  Multiple views of the right and left coronary  arteries were taken.  For the right coronary artery, a JR-4 and an AR-1  catheter were used.  For the left coronary artery, a JL-4 was used.  Following selective coronary angiography, a pigtail catheter was  inserted into the left ventricle and pressures were recorded.  A left  ventriculogram was done.  A pullback across the aortic valve was  performed.   FINDINGS:  Aortic pressure 131/73 with a mean of 95, left ventricular  pressure 130/6 with an end diastolic pressure of 18.   The left main stem has mild nonobstructive plaque disease.  It  trifurcates into the LAD, intermediate branch, and left circumflex.   The LAD is a  large caliber vessel that courses down to the left  ventricular apex.  It gives off one diagonal branch and a second  diagonal distribution.  It also gives off a large first septal  perforator.  There is nonobstructive plaque in the proximal LAD.  There  is no other significant angiographic disease throughout the course of  the LAD.   Ramus intermedius is medium caliber.  There is no significant disease in  that vessel.   The left circumflex system is a large diameter.  It gives off a first  marginal branch and has a 50% stenosis in its proximal portion.  It  bifurcates into twin vessels.  The mid left circumflex has 30% stenosis  beyond the first OM.  It terminates in a left posterolateral branch.  The left circumflex supplies collaterals to the right PDA.   Right coronary artery is 100% occluded in its proximal portion.  It is  small caliber.  There are bridging collaterals feeding the mid portion  of the vessel.  The right PDA is supplied by left sided collaterals.   Left ventriculography performed in the 30 degrees right  anterior oblique  projection demonstrates normal LV function with an EF of 60%.   ASSESSMENT:  1. Two vessel coronary artery disease.  The patient has a chronic      right coronary artery occlusion described his lip time of his last      catheterization in 2001.  He has mild to moderate disease in his      left circumflex.  He has nonobstructive plaque in his LAD.  He has      no areas of high grade stenosis that warrant intervention.  2. Normal left ventricular function.   RECOMMENDATIONS:  I recommend continued medical therapy.  [name]  clearly needs to take responsibility for his medical problems and as he  has coronary artery disease and will require long term risk factor  modification.  At the conclusion of the case, a Starclose device was  placed to seal the patient's femoral arteriotomy.  He will be on bedrest  for two hours.      Veverly Fells.  Excell Seltzer, MD  Electronically Signed     MDC/MEDQ  D:  01/31/2006  T:  02/01/2006  Job:  [id number]

## 2010-07-15 NOTE — Discharge Summary (Signed)
[facility]  Patient:    [name], [name] Visit Number: [id number] MRN: [id number]          Service Type: PSY Location: 40 [id number] Attending Physician:  Geoffery Lyons A Adm. Date:  08/27/2000 Disc. Date: 09/03/2000                             Discharge Summary  CHIEF COMPLAINT AND HISTORY OF PRESENT ILLNESS:  This is the second admission to [facility] for this 55 year old male involuntarily admitted for polysubstance abuse requesting detoxification.  The information was obtained from the chart. The patient did not want to talk.  He had relapsed on crack approximately two days prior to the admission, feeling somewhat depressed, having suicidal ideas with a plan to cut his wrist.  He did have a knife on him.  He was brought to the hospital by a friend.  He had also been experiencing auditory hallucinations to harm himself.  He appeared somewhat intoxicated on arrival.  He had been using crack over the last few days.  He reported poor sleep, decreased appetite for the past six months with a 25 pound weight loss.  The patient experiences positive auditory hallucinations with the devil telling him to cut himself.  No visual hallucinations.  When interviewed by the attending, the patient was still remaining somewhat suicidal.  He reported no auditory hallucinations at the time of the evaluation.  PAST PSYCHIATRIC HISTORY:  [facility] June 2001, for alcohol abuse and psychosis.  Several admissions to [facility].  SUBSTANCE ABUSE HISTORY:  Drinking a couple of beers and relapse on crack cocaine, using $400 over the weekend and marijuana, using two joints a day.  PAST MEDICAL HISTORY:  Coronary artery disease, asthma, hepatitis C, hypertension, chronic back pain, type 2 diabetes.  MEDICATIONS UPON ADMISSION: 1. OxyContin 40 mg twice a day. 2. Altace 10 mg everyday. 3. Amitriptyline 50 mg every night. 4.  Xanax 1 mg three times a day.  ALLERGIES:  He denies any drug allergies.  PHYSICAL EXAMINATION:  There were no particular findings.  LAB WORK-UP: SGOT 42.  Total protein 4.2.  Urine drug screen was positive for benzodiazepines, marijuana and cocaine.  MENTAL STATUS EXAMINATION UPON ADMISSION:  A very sleepy male, difficult to arouse.  He did not want to answer any questions except that he was not hearing voices.  Speech was normal.  Thought process, reported no voices. Difficult to assess the psychosis.  Cognition appeared to be intact.  ADMISSION DIAGNOSES: Axis I.   1. Psychotic disorder, not otherwise specified.           2. Polysubstance abuse, rule out dependence. Axis II.  Deferred. Axis III. 1. Coronary artery disease.           2. Bowel problems.           3. Arthritis.           4. Hepatitis C. Axis IV.  Moderate. Axis V.   Global Assessment of Functioning upon admission 30, highest           Global Assessment of Functioning in the last year 55 to 60.  OTHER LABS PERFORMED:  Thyroid profile was within normal limits.  HOSPITAL COURSE:  He was admitted and started intensive individual and group psychotherapy.  He was detoxified using Librium.  We went ahead and discontinued the Xanax, gave him Seroquel to use  on a p.r.n. basis.  We added Zyprexa 5 mg four times a day and Neurontin 100 mg four times a day.  We started a short course Flexeril 10 mg three times a day.  Amitriptyline was increased to 25 mg per day.  Eventually he started coming around, initially very irritable and agitated, easy to lose control, wanted to be isolated as he did not want to lose control.  No reference to pain or discomfort.  He was quite hostile to begin with the staff, and eventually he settled down. He started taking his medications.  He finally got out of bed, less anxious, complained of nerve, afraid to lose control.  He said he was going to obtain some cocaine.  By September 03, 2000, he had  obtained full benefit from hospitalization, no active suicidal ideas, no homicidal ideas.  No auditory hallucinations, no delusions, totally detoxed.  Willing to pursue treatment on an outpatient basis.  DISCHARGE DIAGNOSES: Axis I.   1. Psychotic disorder, not otherwise specified.           2. Depressive disorder, not otherwise specified.           3. Polysubstance dependence. Axis II.  No diagnosis. Axis III. 1. Coronary artery disease.           2. Bowel problems.           3. Arthritis.           4. Hepatitis C. Axis IV.  Moderate. Axis V.   Global Assessment of Functioning upon discharge 50 to 55.  DISCHARGE MEDICATIONS: 1. Zyprexa 5 mg four times a day. 2. Neurontin 100 mg four times a day. 3. Elavil 75 mg at bedtime. 4. Seroquel 25 mg two at bedtime.  FOLLOW-UP:  He is to follow up with his psychiatrist at the [facility]. Attending Physician:  Rachael Fee DD:  10/16/00 TD:  10/17/00 Job: [id number]

## 2010-07-15 NOTE — Consult Note (Signed)
NAME:  [name], [name] NO.:  [id number]   MEDICAL RECORD NO.:  [id number]          PATIENT TYPE:  INP   LOCATION:  3736                         FACILITY:  [facility]   PHYSICIAN:  Rollene Rotunda, MD, FACCDATE OF BIRTH:  [birth year]   DATE OF CONSULTATION:  DATE OF DISCHARGE:                                 CONSULTATION   ADDENDUM:  Given the patient's presentation with questionable  angioedema, we will not initiate ACE inhibitor at this time, as not to  cloud the picture of his tongue and throat swelling.      Nicolasa Ducking, ANP      Rollene Rotunda, MD, [facility]  Electronically Signed    CB/MEDQ  D:  01/25/2006  T:  01/26/2006  Job:  [id number]
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
## 2010-07-15 NOTE — Cardiovascular Report (Signed)
[name]  Patient:    [name], [name]                      MRN: [id number] Proc. Date: 07/29/99 Adm. Date:  [id number] Disc. Date: [id number] Attending:  Avie Echevaria CC:         Corwin Levins, M.D. [facility]                        Cardiac Catheterization  DATE OF BIRTH:  Jul 01, 1951  PRIMARY PHYSICIAN:  Corwin Levins, M.D.  PROCEDURE:  Left heart cardiac catheterization.  REASON FOR PRESENTATION:  Evaluate patient with chest discomfort, shortness of breath, and a Cardiolite suggesting inferior infarct and ischemia.  DESCRIPTION OF PROCEDURE:  Left heart catheterization was performed via the right femoral artery.  The artery was cannulated using an anterior wall puncture.  A #6 French arterial sheath was inserted via the modified Seldinger technique.  Preformed Judkins and a pigtail catheter were utilized.  The patient tolerated the procedure well and left the lab in stable condition.  RESULTS:  HEMODYNAMICS:  LV 114/8, AO 116/68.  CORONARY ARTERIOGRAPHY:  Left main:  The left main coronary artery was normal.  Left anterior descending:  The LAD had luminal irregularities.  There was a long mid muscle bridge with systolic contraction.  Circumflex:  The OM-1 had an ostial 70% stenosis.  It was a medium sized vessel.  An OM-2 was a large vessel and had a long 50% stenosis.  Right coronary artery:  The right coronary artery was a dominant vessel.  It was occluded proximally.  There were left to right collaterals demonstrated.  LEFT VENTRICULOGRAM:  The left ventriculogram was obtained in the RAO and LAO projections.  The EF was 65% with no high-grade wall motion abnormalities.  AORTOGRAM:  The distal aortogram was obtained secondary to the patients complaint of leg pain and fatigue with walking.  There were diffuse luminal irregularities but no high-grade lesions.  CONCLUSIONS:  Severe single-vessel coronary artery disease with  nonobstructive disease elsewhere.  PLAN:  The patient should have aggressive secondary risk factor modification. In particularly, he needs to stop smoking.  He will be started on a lipid lowering agent.  He can be followed by his primary care physician for risk factor modification and in our cardiology clinic. DD:  07/29/99 TD:  08/01/99 Job: [id number]

## 2010-07-15 NOTE — Discharge Summary (Signed)
[place]  Patient:    [name], [name]                      MRN: [id number] Adm. Date:  [id number] Disc. Date: 01/11/00 Attending:  Duke Salvia                           Discharge Summary  ADDENDUM  In the interval since the discharge dictation was done on January 09, 2000, the patient has undergone biopsy ultrasound guided of the liver.  He tolerated this well with no complications.  Pathology reports are pending.  The patient at this point is felt to be ready for discharge with no need for further inpatient evaluation or testing.  Final diagnosis for the patient diffuse myalgias and arthralgias is vague. There is evidently no GI source given that his hepatitis C screens were positive for prior infection, but there is no evidence of any active disease, even though the biopsy specimen is pending.  Rheumatology evaluation was unrevealing with normal sedimentation rate.  Other labs are still pending. The patient did have an elevated creatinine kinase at 1710, suggestive of a possible myelocytis.  It could have been related to a statin drug which has been discontinued.  DISCHARGE MEDICATIONS:  1. Restoril 30 mg q.h.s. for sleep.  2. Xanax 0.5 mg t.i.d. for anxiety.  3. Altace 2.5 mg daily.  4. Lasix 40 mg daily.  5. Methadone 20 mg a.m. and p.m.  6. Duragesic patch 100 mcg apply q.72h.  7. Prednisone 10 mg daily.  8. Pulmicort inhaler 2 puffs b.i.d.  9. Serevent discus 1 inhalation b.i.d. 10. Metoprolol 25 mg b.i.d.  DISPOSITION:  The patient is discharged home on the extensive pain medications as listed.  He is to see Dr. Debby Bud in followup on February 02, 2000, at 9:15 a.m.  CONDITION ON DISCHARGE:  Stable. DD:  01/11/00 TD:  01/11/00 Job: [id number]
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
## 2010-07-15 NOTE — Discharge Summary (Signed)
NAME:  [name], INGWERSEN NO.:  [id number]   MEDICAL RECORD NO.:  [id number]          PATIENT TYPE:  INP   LOCATION:  2014                         FACILITY:  [facility]   PHYSICIAN:  Bevelyn Buckles. Bensimhon, MDDATE OF BIRTH:  [birth year]   DATE OF ADMISSION:  05/08/2007  DATE OF DISCHARGE:  05/09/2007                               DISCHARGE SUMMARY   PRIMARY CARDIOLOGIST:  Dr. Rollene Rotunda.   PRIMARY CARE Sandrika Schwinn:  Dr. Reche Dixon, [facility].   DISCHARGE DIAGNOSIS:  Atrial fibrillation/flutter with rapid ventricular  response.   SECONDARY DIAGNOSES:  1. Chest pain with history of coronary artery disease.  2. History of normal left ventricular function.  3. Ongoing tobacco abuse.  4. Hypertension.  5. Hyperlipidemia.  6. Diabetes mellitus.  7. History of polysubstance abuse of cocaine, methadone, and THC.  8. Hepatitis C and B.  9. Noncompliance.  10.Depression.  11.Psychosis.  12.Osteoarthritis.  13.Gastroesophageal reflux disease.   ALLERGIES:  Intolerant to MORPHINE secondary to headache.   PROCEDURES:  CT angio of the chest, abdomen, and pelvis showing no acute  abnormalities.   HISTORY OF PRESENT ILLNESS:  A 55 year old male with known coronary  artery disease was in his usual state of health until 10:00 a.m. on  May 08, 2007, when he had sudden onset of substernal chest discomfort  associated with diaphoresis and numbness in hands and feet along with  shortness of breath.  His girlfriend drove him to the [facility] ED, and  he was noted to be tachycardiac with a rate of 167.  He was treated with  adenosine with slowing of the rhythm.  He appeared to be in atrial  flutter with a variable ventricular response.  He was then treated with  Cardizem for his flutter.  Heart rate slowed further to 116, and his  rhythm appeared to be atrial fibrillation.  It was subsequently  converted to sinus rhythm.  The patient was admitted for further  evaluation.   HOSPITAL COURSE:  [name] ruled out for MI, and heparin and nitrates  were discontinued.  He was noted to exhibit leukocytosis with a white  count 18.9 as well as eosinophilia, both of which are chronic for him.  A CT of the chest, abdomen, and pelvis was performed and showed no acute  abnormalities.  A 2D echocardiogram was ordered given his arrhythmia.  However, the patient unfortunately left against medical advice on May 10, 2007, prior to performance of a 2D echocardiogram.   Labs at the time of leaving, hemoglobin 14.4, hematocrit 41.7, WBC 14.5,  platelets 320.  Sodium 141, potassium 4.3, chloride 104, CO2 28, BUN 8,  creatinine 1.4, glucose 104.  Total bilirubin 0.3, alkaline phosphatase  77, AST 21, ALT 24, total protein 5.9, albumin 3.1, calcium 8.6, LDH  127, hemoglobin A1c 7.1, CK 204, MB 3.7, troponin I 0.03.  Total  cholesterol 141, triglycerides 161, HDL 23, LDL 86.  TSH 0.744.  Urine  drug screen was positive for opiates and THC.  Urinalysis was negative.  ANA was negative.  Urine culture showed no growth.   DISPOSITION:  The patient left against medical advice.   FOLLOWUP PLANS AND APPOINTMENTS:  None were able to be arranged.   DISCHARGE MEDICATIONS:  The patient did not wait for discharge  paperwork.  Outstanding lab studies, 2D echocardiogram was pending.   Duration of discharge, encounter 20 minutes.      Nicolasa Ducking, ANP      Bevelyn Buckles. Bensimhon, MD  Electronically Signed    CB/MEDQ  D:  06/17/2007  T:  06/18/2007  Job:  [id number]
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
## 2010-10-30 IMAGING — CT CT HEAD W/O CM
3 of 4 series · 16 of 40 positions shown, 19 images · non-contrast
Comparison: 03/11/2009

CT HEAD

CLINICAL DATA: Fell, striking back of head.  Neck pain.

CT HEAD WITHOUT CONTRAST
CT CERVICAL SPINE WITHOUT CONTRAST
TECHNIQUE: Multidetector CT imaging of the head and cervical spine
was performed following the standard protocol without intravenous
contrast.  Multiplanar CT image reconstructions of the cervical
spine were also generated.

[Series 3: head_seq 4.5 h37s st · axial · 0.43mm/px · z∈[+743,+779]mm · 2 of 32 slices shown]
[im 8/32  brain]
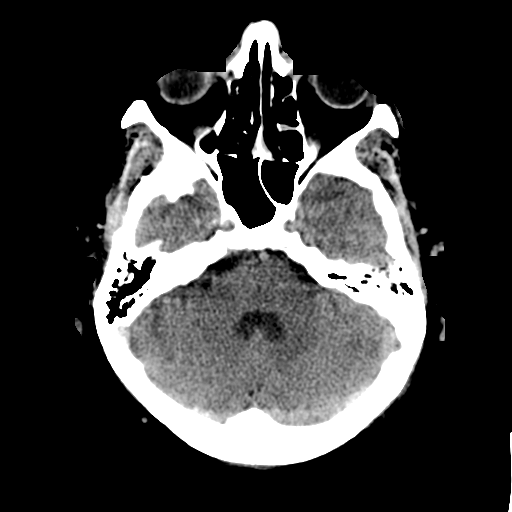
[im 16/32  brain]
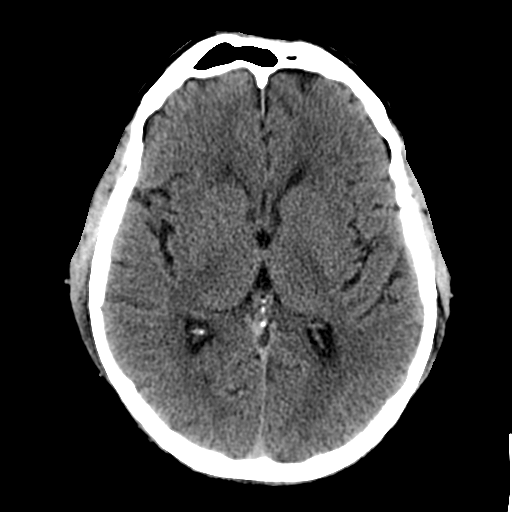

[Series 602: <mpr thick range> · coronal · 0.34mm/px · 3 of 40 slices shown]
[im 14/40  brain]
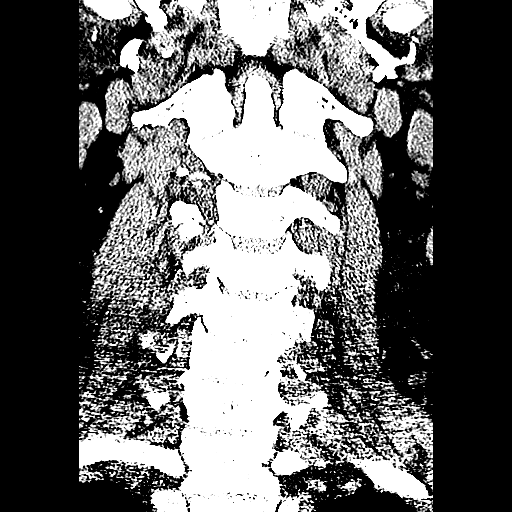
[im 18/40  brain]
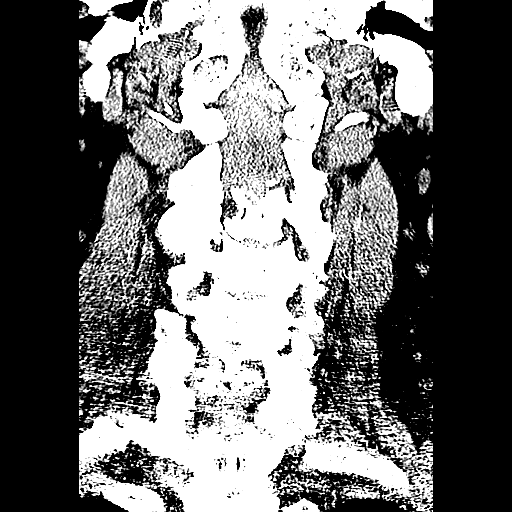
[im 22/40  brain]
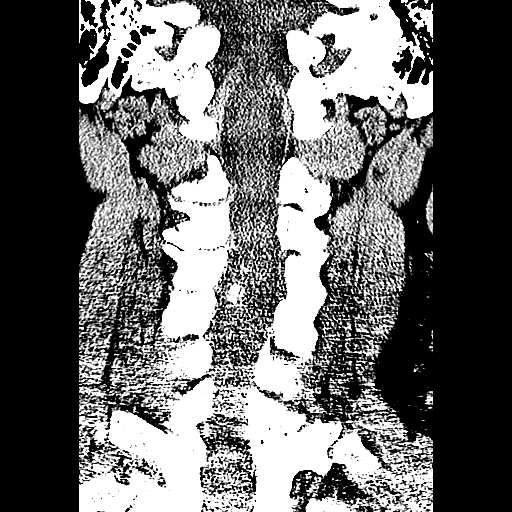

[Series 603: <mpr thick range(1)> · axial · 0.34mm/px · z∈[+549,+697]mm · 11 of 93 slices shown, 14 images]
[im 8/93  brain]
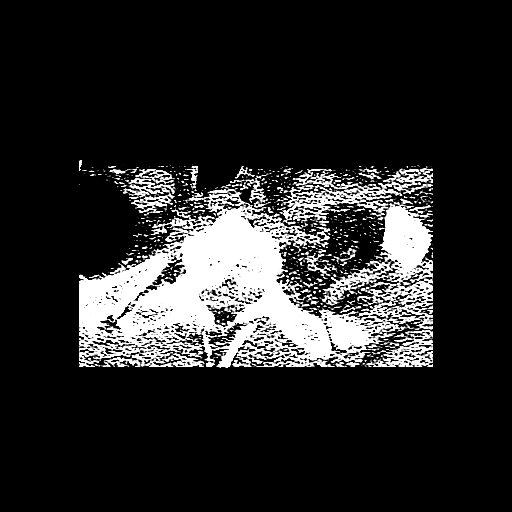
[im 8/93  bone]
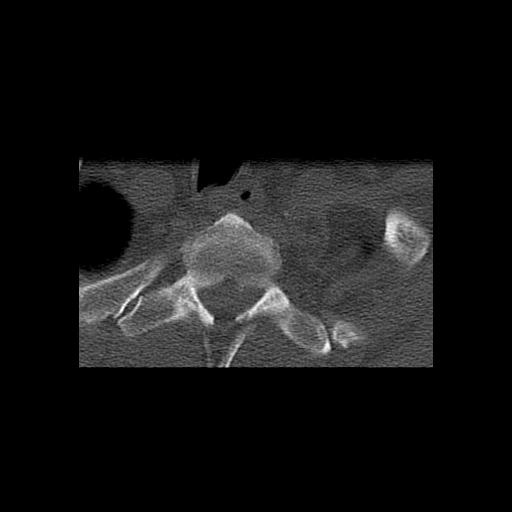
[im 16/93  brain]
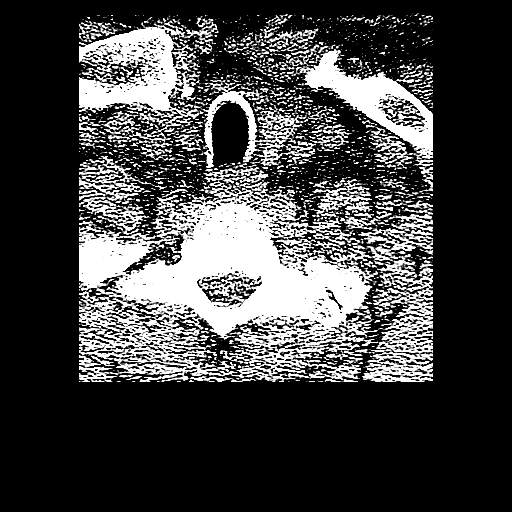
[im 24/93  brain]
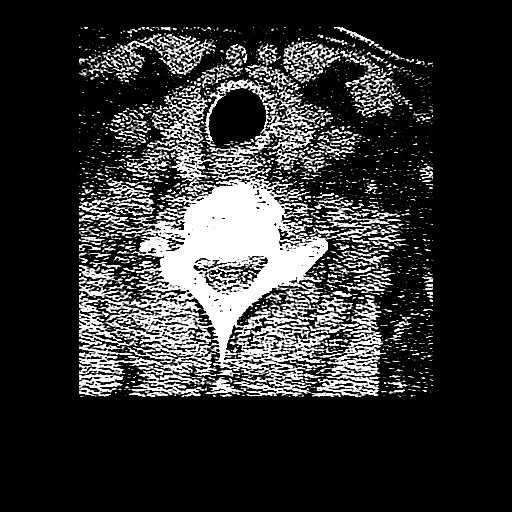
[im 31/93  brain]
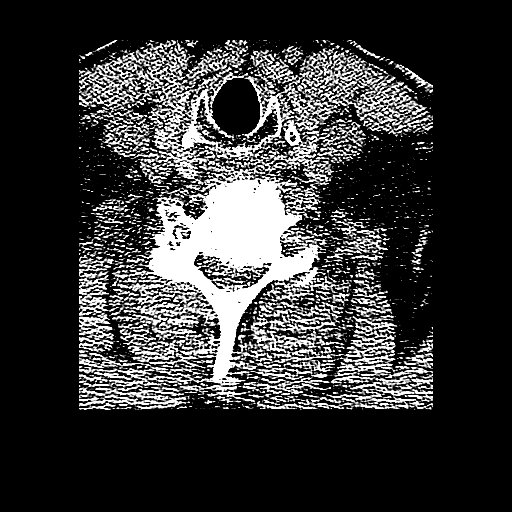
[im 39/93  brain]
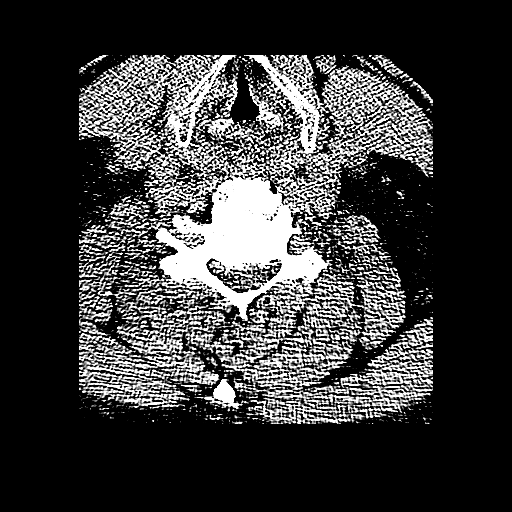
[im 39/93  bone]
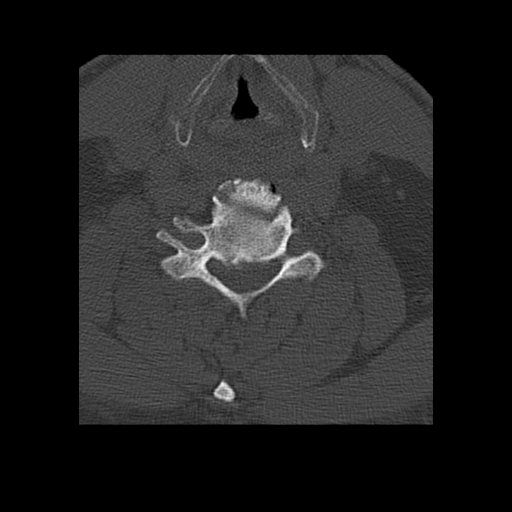
[im 47/93  brain]
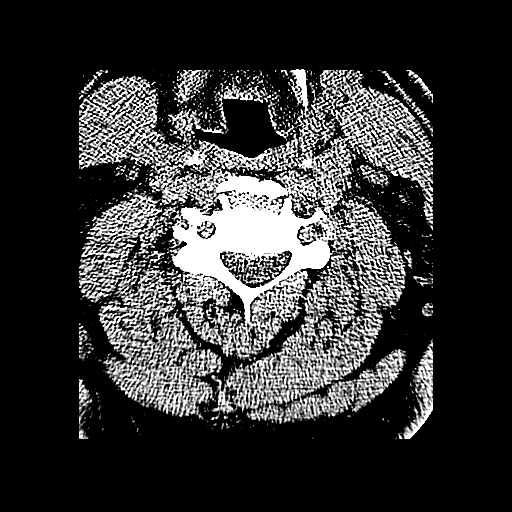
[im 54/93  brain]
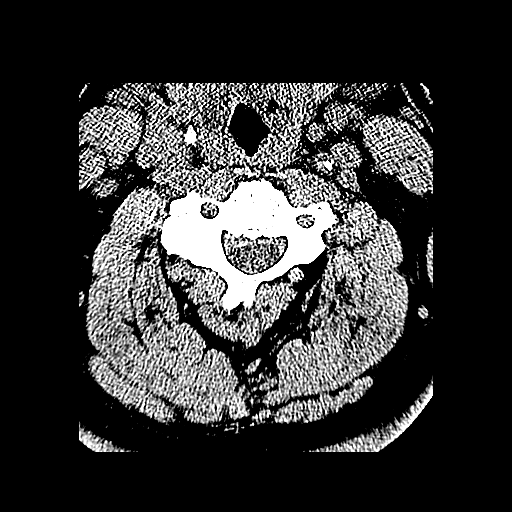
[im 62/93  brain]
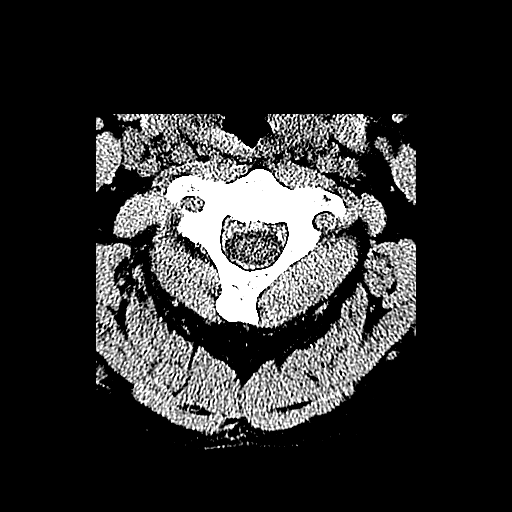
[im 70/93  brain]
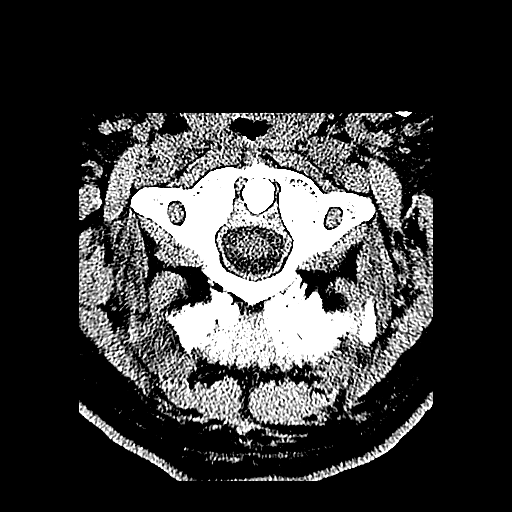
[im 70/93  bone]
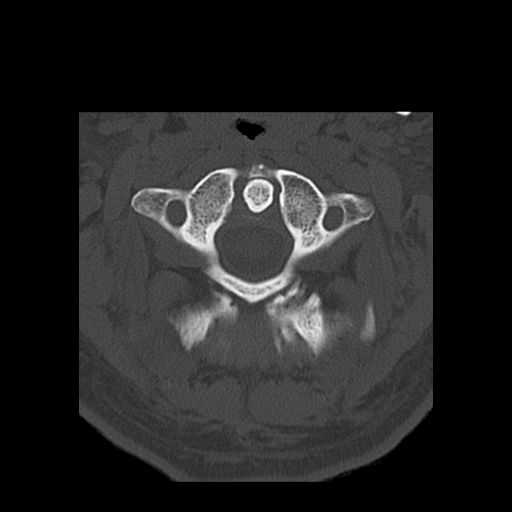
[im 77/93  brain]
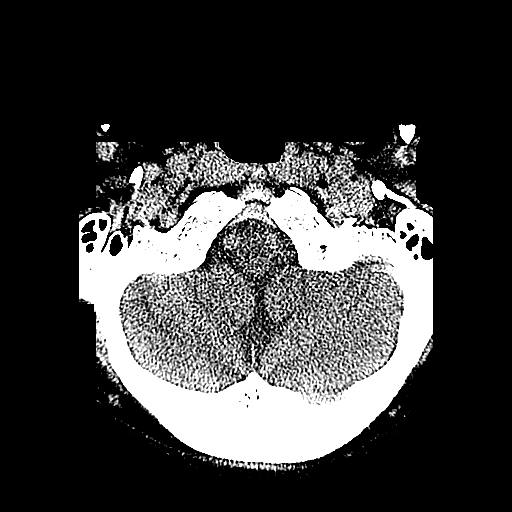
[im 85/93  brain]
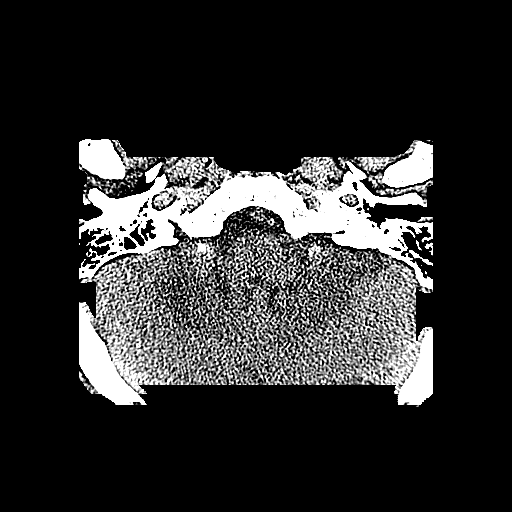

[16 of 40 positions shown; findings below may reference images not displayed]

FINDINGS: Ventricular size within normal limits.  No evidence for
acute infarct, hemorrhage, or mass lesion. No extra-axial fluid
collections or midline shift.  Calvarium intact.  No fluid in the
sinuses visualized.

There are changes of chronic sinusitis with interval improvement.
IMPRESSION: No acute findings.

CT CERVICAL SPINE
FINDINGS: Again noted are moderately advanced changes of
degenerative disc disease with foraminal and spinal stenosis.  No
definite acute fractures or significant interval change compared to
the prior study.  Paraspinous soft tissues unremarkable.
IMPRESSION: Advanced degenerative changes as before.  No acute findings.

## 2010-10-30 IMAGING — CR DG CHEST 2V
2 series · 2 of 2 positions shown · non-contrast
Comparison: 04/12/2009

CLINICAL DATA: Fell/total body pain

CHEST - 2 VIEW

[w chest lat]
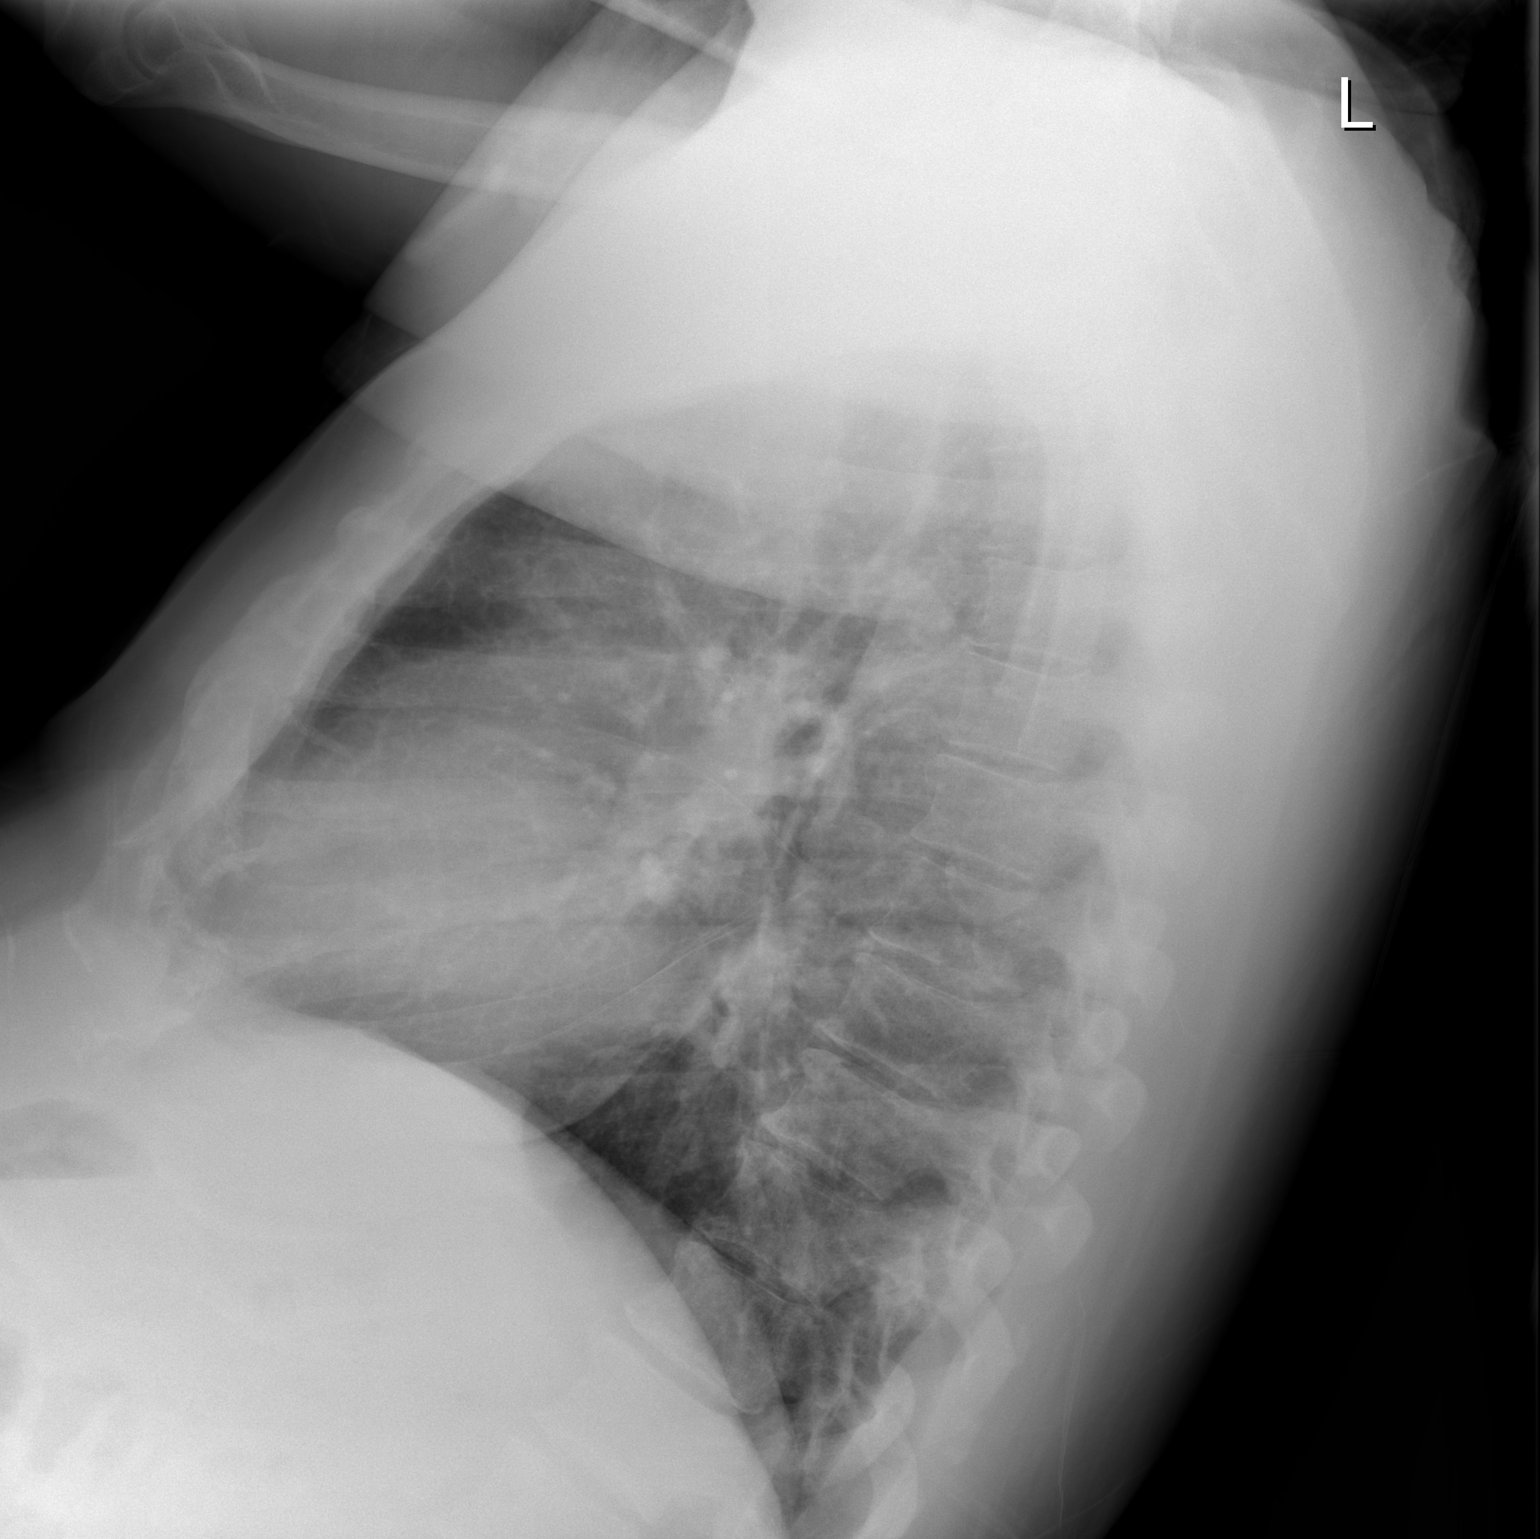

[view not recorded]
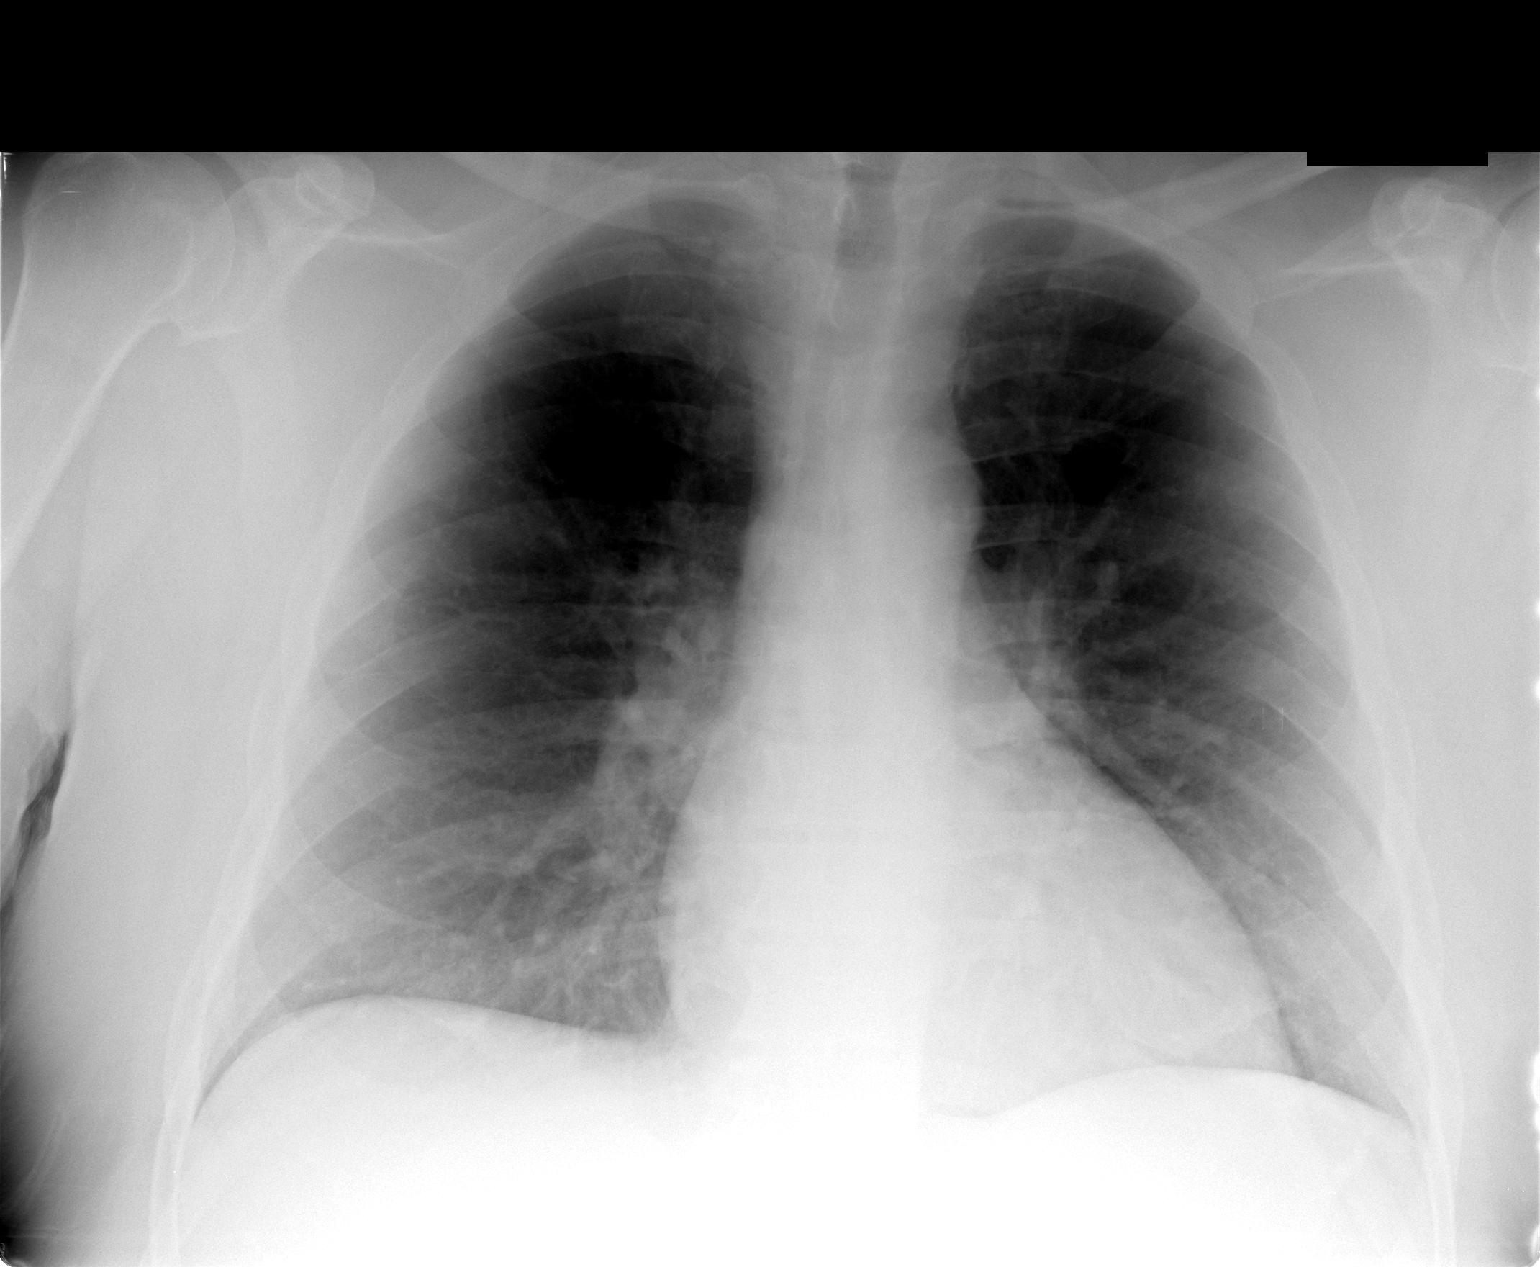

[2 of 2 positions shown; findings below may reference images not displayed]

FINDINGS: AP and upright and lateral views were obtained.

Heart and mediastinal contours normal.  Lungs clear.  No pleural
fluid.  Osseous structures and soft tissues unremarkable.
IMPRESSION: No acute or significant findings.

## 2010-11-21 LAB — URINALYSIS, ROUTINE W REFLEX MICROSCOPIC
Bilirubin Urine: NEGATIVE
Ketones, ur: NEGATIVE
Nitrite: NEGATIVE
Protein, ur: NEGATIVE
Urobilinogen, UA: 0.2
pH: 5.5

## 2010-11-21 LAB — DIFFERENTIAL
Basophils Absolute: 0.1
Basophils Absolute: 0.2 — ABNORMAL HIGH
Basophils Relative: 1
Basophils Relative: 1
Eosinophils Absolute: 2.1 — ABNORMAL HIGH
Eosinophils Relative: 12 — ABNORMAL HIGH
Eosinophils Relative: 18 — ABNORMAL HIGH
Monocytes Absolute: 1.1 — ABNORMAL HIGH
Neutro Abs: 5.8

## 2010-11-21 LAB — CBC
HCT: 50.3
Hemoglobin: 14.4
MCHC: 34.6
MCV: 96.3
MCV: 96.4
Platelets: 301
Platelets: 320
Platelets: 344
RBC: 4.33
RBC: 4.34
RDW: 13.7
RDW: 13.8
WBC: 18.9 — ABNORMAL HIGH

## 2010-11-21 LAB — LIPID PANEL
HDL: 23 — ABNORMAL LOW
VLDL: 32

## 2010-11-21 LAB — CARDIAC PANEL(CRET KIN+CKTOT+MB+TROPI)
CK, MB: 3.7
Relative Index: 1.8
Total CK: 204
Total CK: 248 — ABNORMAL HIGH
Troponin I: 0.03

## 2010-11-21 LAB — RAPID URINE DRUG SCREEN, HOSP PERFORMED
Amphetamines: NOT DETECTED
Cocaine: NOT DETECTED
Opiates: POSITIVE — AB
Tetrahydrocannabinol: POSITIVE — AB

## 2010-11-21 LAB — I-STAT 8, (EC8 V) (CONVERTED LAB)
Acid-Base Excess: 1
Bicarbonate: 26.3 — ABNORMAL HIGH
HCT: 52
Operator id: 234501
pCO2, Ven: 41.5 — ABNORMAL LOW

## 2010-11-21 LAB — PROTIME-INR
INR: 1
Prothrombin Time: 13.7

## 2010-11-21 LAB — TSH: TSH: 0.744

## 2010-11-21 LAB — TROPONIN I: Troponin I: 0.02

## 2010-11-21 LAB — POCT CARDIAC MARKERS
CKMB, poc: 2.5
Troponin i, poc: <0.05

## 2010-11-21 LAB — HEMOGLOBIN A1C
Hgb A1c MFr Bld: 7.1 — ABNORMAL HIGH
Mean Plasma Glucose: 175

## 2010-11-21 LAB — BASIC METABOLIC PANEL
BUN: 8
CO2: 28
Calcium: 8.6
Creatinine, Ser: 1.04
GFR calc non Af Amer: >60
Glucose, Bld: 104 — ABNORMAL HIGH
Sodium: 141

## 2010-11-21 LAB — LACTATE DEHYDROGENASE: LDH: 127

## 2010-11-21 LAB — COMPREHENSIVE METABOLIC PANEL
Albumin: 3.1 — ABNORMAL LOW
BUN: 8
Chloride: 106
Creatinine, Ser: 0.85
GFR calc non Af Amer: >60
Total Bilirubin: 0.3

## 2010-11-21 LAB — URINE CULTURE
Colony Count: NO GROWTH
Culture: NO GROWTH

## 2010-11-21 LAB — POCT I-STAT CREATININE: Creatinine, Ser: 1

## 2010-11-21 LAB — B-NATRIURETIC PEPTIDE (CONVERTED LAB): Pro B Natriuretic peptide (BNP): <30

## 2010-11-21 LAB — MAGNESIUM: Magnesium: 1.8

## 2010-11-21 LAB — ANA: Anti Nuclear Antibody(ANA): NEGATIVE

## 2010-11-21 LAB — HEPARIN LEVEL (UNFRACTIONATED): Heparin Unfractionated: <0.1 — ABNORMAL LOW

## 2010-11-21 LAB — TECHNOLOGIST SMEAR REVIEW

## 2010-11-21 LAB — CK TOTAL AND CKMB (NOT AT ARMC): Relative Index: 1.2

## 2010-11-21 LAB — SEDIMENTATION RATE: Sed Rate: 2

## 2010-11-23 LAB — DIFFERENTIAL
Basophils Absolute: 0.1
Lymphocytes Relative: 38
Lymphs Abs: 5.3 — ABNORMAL HIGH
Neutro Abs: 6.8

## 2010-11-23 LAB — CBC
Hemoglobin: 17.7 — ABNORMAL HIGH
Platelets: 354
RDW: 13.3
WBC: 14 — ABNORMAL HIGH

## 2010-11-23 LAB — POCT I-STAT, CHEM 8
Creatinine, Ser: 1.1
HCT: 54 — ABNORMAL HIGH
Hemoglobin: 18.4 — ABNORMAL HIGH
Potassium: 4.9
Sodium: 135
TCO2: 25

## 2010-11-24 LAB — URINALYSIS, ROUTINE W REFLEX MICROSCOPIC
Bilirubin Urine: NEGATIVE
Glucose, UA: NEGATIVE
Hgb urine dipstick: NEGATIVE
Ketones, ur: NEGATIVE
Protein, ur: 100 — AB

## 2010-11-24 LAB — DIFFERENTIAL
Basophils Relative: 2 — ABNORMAL HIGH
Eosinophils Absolute: 0.4
Eosinophils Relative: 4
Lymphs Abs: 4.8 — ABNORMAL HIGH
Monocytes Absolute: 1.2 — ABNORMAL HIGH
Monocytes Relative: 11

## 2010-11-24 LAB — POCT CARDIAC MARKERS
CKMB, poc: 1.4
Myoglobin, poc: 149
Myoglobin, poc: 83.8
Operator id: 277751
Troponin i, poc: <0.05

## 2010-11-24 LAB — URINE MICROSCOPIC-ADD ON

## 2010-11-24 LAB — CBC
Hemoglobin: 17.1 — ABNORMAL HIGH
MCHC: 35.1
RBC: 5.14
RDW: 13.6

## 2010-11-24 LAB — COMPREHENSIVE METABOLIC PANEL
ALT: 29
AST: 34
Alkaline Phosphatase: 71
Calcium: 9.5
GFR calc Af Amer: >60
Potassium: 3.9
Sodium: 139
Total Protein: 6.6

## 2010-11-25 LAB — CBC
HCT: 49.2
HCT: 50.1
Hemoglobin: 14.8
Hemoglobin: 15.1
Hemoglobin: 16
Hemoglobin: 16.5
Hemoglobin: 16.7
MCHC: 33
MCHC: 33.4
MCHC: 33.5
MCHC: 34.1
MCV: 94.8
MCV: 95.3
MCV: 97.3
Platelets: 349
RBC: 4.5
RBC: 4.66
RBC: 4.95
RBC: 5.05
RBC: 5.19
RBC: 5.38
RDW: 13.5
RDW: 14
RDW: 14
RDW: 14.1
WBC: 17.2 — ABNORMAL HIGH
WBC: 17.3 — ABNORMAL HIGH

## 2010-11-25 LAB — GLUCOSE, CAPILLARY
Glucose-Capillary: 101 — ABNORMAL HIGH
Glucose-Capillary: 114 — ABNORMAL HIGH
Glucose-Capillary: 122 — ABNORMAL HIGH
Glucose-Capillary: 123 — ABNORMAL HIGH
Glucose-Capillary: 128 — ABNORMAL HIGH
Glucose-Capillary: 130 — ABNORMAL HIGH
Glucose-Capillary: 134 — ABNORMAL HIGH
Glucose-Capillary: 141 — ABNORMAL HIGH
Glucose-Capillary: 145 — ABNORMAL HIGH
Glucose-Capillary: 159 — ABNORMAL HIGH
Glucose-Capillary: 163 — ABNORMAL HIGH
Glucose-Capillary: 173 — ABNORMAL HIGH
Glucose-Capillary: 179 — ABNORMAL HIGH
Glucose-Capillary: 193 — ABNORMAL HIGH
Glucose-Capillary: 236 — ABNORMAL HIGH
Glucose-Capillary: 251 — ABNORMAL HIGH
Glucose-Capillary: 278 — ABNORMAL HIGH
Glucose-Capillary: 80
Glucose-Capillary: 92

## 2010-11-25 LAB — COMPREHENSIVE METABOLIC PANEL
ALT: 18
ALT: 19
AST: 17
Alkaline Phosphatase: 58
Alkaline Phosphatase: 72
CO2: 25
CO2: 28
Calcium: 8.6
GFR calc Af Amer: >60
GFR calc non Af Amer: >60
GFR calc non Af Amer: >60
Glucose, Bld: 112 — ABNORMAL HIGH
Glucose, Bld: 97
Potassium: 3.7
Potassium: 3.9
Sodium: 138
Sodium: 141
Total Bilirubin: 1.1
Total Protein: 5.9 — ABNORMAL LOW

## 2010-11-25 LAB — CARDIAC PANEL(CRET KIN+CKTOT+MB+TROPI)
CK, MB: 2
CK, MB: 2.4
CK, MB: 3.3
CK, MB: 4.3 — ABNORMAL HIGH
Relative Index: 2.6 — ABNORMAL HIGH
Relative Index: INVALID
Relative Index: INVALID
Relative Index: INVALID
Total CK: 56
Total CK: 76
Total CK: 93
Troponin I: 0.02
Troponin I: 0.02
Troponin I: 0.02
Troponin I: 0.02
Troponin I: 0.05

## 2010-11-25 LAB — URINALYSIS, ROUTINE W REFLEX MICROSCOPIC
Glucose, UA: NEGATIVE
Hgb urine dipstick: NEGATIVE
Ketones, ur: NEGATIVE
Protein, ur: NEGATIVE

## 2010-11-25 LAB — BASIC METABOLIC PANEL
BUN: 12
BUN: 8
CO2: 28
CO2: 33 — ABNORMAL HIGH
CO2: 34 — ABNORMAL HIGH
Calcium: 9
Calcium: 9.5
Chloride: 96
Chloride: 97
Chloride: 97
Creatinine, Ser: 0.69
Creatinine, Ser: 0.82
GFR calc Af Amer: >60
GFR calc Af Amer: >60
GFR calc Af Amer: >60
GFR calc non Af Amer: >60
Glucose, Bld: 112 — ABNORMAL HIGH
Potassium: 4.3
Sodium: 139
Sodium: 140

## 2010-11-25 LAB — HEMOGLOBIN A1C: Hgb A1c MFr Bld: 6.3 — ABNORMAL HIGH

## 2010-11-25 LAB — RAPID URINE DRUG SCREEN, HOSP PERFORMED
Amphetamines: NOT DETECTED
Opiates: POSITIVE — AB
Tetrahydrocannabinol: POSITIVE — AB

## 2010-11-25 LAB — CULTURE, BLOOD (ROUTINE X 2)
Culture: NO GROWTH
Culture: NO GROWTH

## 2010-11-25 LAB — FECAL LACTOFERRIN, QUANT: Fecal Lactoferrin: POSITIVE

## 2010-11-25 LAB — EXPECTORATED SPUTUM ASSESSMENT W GRAM STAIN, RFLX TO RESP C

## 2010-11-25 LAB — EHEC TOXIN BY EIA, STOOL: EHEC Toxin by EIA: NEGATIVE

## 2010-11-25 LAB — CULTURE, RESPIRATORY W GRAM STAIN

## 2010-11-25 LAB — POCT I-STAT, CHEM 8
Creatinine, Ser: 1.1
HCT: 54 — ABNORMAL HIGH
Hemoglobin: 18.4 — ABNORMAL HIGH
Sodium: 138
TCO2: 25

## 2010-11-25 LAB — OVA AND PARASITE EXAMINATION

## 2010-11-25 LAB — CLOSTRIDIUM DIFFICILE EIA: C difficile Toxins A+B, EIA: NEGATIVE

## 2010-11-25 LAB — STOOL CULTURE

## 2010-11-25 LAB — OCCULT BLOOD X 1 CARD TO LAB, STOOL: Fecal Occult Bld: NEGATIVE

## 2010-11-25 LAB — B-NATRIURETIC PEPTIDE (CONVERTED LAB): Pro B Natriuretic peptide (BNP): <30

## 2010-12-02 LAB — POCT CARDIAC MARKERS: Troponin i, poc: <0.05

## 2010-12-02 LAB — COMPREHENSIVE METABOLIC PANEL
ALT: 28
AST: 30
Albumin: 3.7
Alkaline Phosphatase: 66
CO2: 28
Chloride: 102
Creatinine, Ser: 1.17
GFR calc Af Amer: >60
Potassium: 3.8
Sodium: 138
Total Bilirubin: 1

## 2010-12-02 LAB — CK TOTAL AND CKMB (NOT AT ARMC): Relative Index: 1.5

## 2010-12-02 LAB — I-STAT 8, (EC8 V) (CONVERTED LAB)
Acid-Base Excess: 4 — ABNORMAL HIGH
Chloride: 107
Hemoglobin: 17
Potassium: 4
Sodium: 137
TCO2: 27
pH, Ven: 7.496 — ABNORMAL HIGH

## 2010-12-02 LAB — CARDIAC PANEL(CRET KIN+CKTOT+MB+TROPI)
CK, MB: 4.4 — ABNORMAL HIGH
CK, MB: 5 — ABNORMAL HIGH
Relative Index: 1.8
Relative Index: 1.8
Total CK: 274 — ABNORMAL HIGH
Troponin I: 0.03
Troponin I: 0.04

## 2010-12-02 LAB — CBC
HCT: 47.6
Hemoglobin: 16.3
MCV: 96.1
RBC: 4.96
WBC: 14.7 — ABNORMAL HIGH

## 2010-12-02 LAB — URINALYSIS, MICROSCOPIC ONLY
Bilirubin Urine: NEGATIVE
Glucose, UA: NEGATIVE
Nitrite: NEGATIVE
Specific Gravity, Urine: 1.01
pH: 5.5

## 2010-12-02 LAB — LIPID PANEL: Triglycerides: 331 — ABNORMAL HIGH

## 2010-12-02 LAB — TROPONIN I: Troponin I: 0.02

## 2010-12-02 LAB — POCT I-STAT CREATININE: Operator id: 146091

## 2010-12-02 LAB — URINE CULTURE: Special Requests: NEGATIVE

## 2011-08-02 ENCOUNTER — Ambulatory Visit (INDEPENDENT_AMBULATORY_CARE_PROVIDER_SITE_OTHER): Payer: Medicaid Other | Admitting: Pulmonary Disease

## 2011-08-02 ENCOUNTER — Encounter: Payer: Self-pay | Admitting: Pulmonary Disease

## 2011-08-02 VITALS — BP 122/62 | HR 69 | Temp 98.0°F | Ht 67.5 in | Wt 230.4 lb

## 2011-08-02 DIAGNOSIS — G4733 Obstructive sleep apnea (adult) (pediatric): Secondary | ICD-10-CM | POA: Insufficient documentation

## 2011-08-02 NOTE — Progress Notes (Signed)
  Subjective:    Patient ID: [name], male    DOB: 1955/08/06, 56 y.o.   MRN: [id number]  HPI The patient is a 56 year old male who had been asked to see for possible obstructive sleep apnea.  The patient has been noted to have some snoring as well as an abnormal breathing pattern during sleep according to his sister.  The patient also admits to having choking arousals.  He has frequent awakenings at night, and does not feel rested in the mornings upon arising.  He has significant sleepiness during the day with periods of inactivity, and also will fall asleep in the evenings watching television.  It should be noted that he has a significant issue with chronic back pain, and takes quite a bit of sedating medications during the day for pain.  He currently does not drive.  The patient states that his weight is down 10 pounds over the last 2 years, and his Epworth score is 10 today.  Sleep Questionnaire: What time do you typically go to bed?( Between what hours) varies How long does it take you to fall asleep? varies How many times during the night do you wake up? 10 What time do you get out of bed to start your day? 0900 Do you drive or operate heavy machinery in your occupation? No How much has your weight changed (up or down) over the past two years? (In pounds) 10 lb (4.536 kg) Have you ever had a sleep study before? No Do you currently use CPAP? No Do you wear oxygen at any time? No    Review of Systems  Constitutional: Positive for unexpected weight change. Negative for fever.  HENT: Positive for congestion. Negative for ear pain, nosebleeds, sore throat, rhinorrhea, sneezing, trouble swallowing, dental problem, postnasal drip and sinus pressure.   Eyes: Negative for redness and itching.  Respiratory: Positive for cough, chest tightness and shortness of breath. Negative for wheezing.   Cardiovascular: Positive for palpitations. Negative for leg swelling.  Gastrointestinal: Negative for nausea  and vomiting.  Genitourinary: Negative for dysuria.  Musculoskeletal: Negative for joint swelling.  Skin: Negative for rash.  Neurological: Positive for headaches.  Hematological: Does not bruise/bleed easily.  Psychiatric/Behavioral: Positive for dysphoric mood. The patient is not nervous/anxious.        Objective:   Physical Exam Constitutional:  Obese male, no acute distress  HENT:  Nares patent without discharge, but deviated septum to left with narrowing  Oropharynx without exudate, palate and uvula are thick and elongated.   Eyes:  Perrla, eomi, no scleral icterus  Neck:  No JVD, no TMG  Cardiovascular:  Normal rate, regular rhythm, no rubs or gallops.  No murmurs        Intact distal pulses  Pulmonary :  Normal breath sounds, no stridor or respiratory distress   No rales or wheezing, +rhonchi  Abdominal:  Soft, nondistended, bowel sounds present.  No tenderness noted.   Musculoskeletal:  mild lower extremity edema noted.  Lymph Nodes:  No cervical lymphadenopathy noted  Skin:  No cyanosis noted  Neurologic:  Sleepy, appropriate, moves all 4 extremities without obvious deficit.         Assessment & Plan:

## 2011-08-02 NOTE — Patient Instructions (Signed)
Will schedule for sleep study, and arrange followup once the results are available. Work on weight loss  

## 2011-08-02 NOTE — Assessment & Plan Note (Signed)
The patient's history is very suggestive of clinically significant sleep apnea.  However, he has a very severe chronic pain syndrome, and takes a lot of pain medications during the day that can sedate him significantly.  I think he needs a sleep study at this point for clarification, and I have discussed with him the pathophysiology of sleep apnea including its impact on his quality of life and cardiovascular health.

## 2011-08-02 NOTE — Progress Notes (Signed)
Addended by: Nita Sells on: 08/02/2011 05:04 PM   Modules accepted: Orders

## 2011-08-27 ENCOUNTER — Ambulatory Visit (HOSPITAL_BASED_OUTPATIENT_CLINIC_OR_DEPARTMENT_OTHER): Payer: Medicaid Other | Attending: Pulmonary Disease | Admitting: Radiology

## 2011-08-27 VITALS — Ht 67.5 in | Wt 230.0 lb

## 2011-08-27 DIAGNOSIS — G4733 Obstructive sleep apnea (adult) (pediatric): Secondary | ICD-10-CM | POA: Insufficient documentation

## 2011-09-02 DIAGNOSIS — G4733 Obstructive sleep apnea (adult) (pediatric): Secondary | ICD-10-CM

## 2011-09-02 NOTE — Procedures (Signed)
NAME:  [name], [name] NO.:  [id number]  MEDICAL RECORD NO.:  [id number]          PATIENT TYPE:  OUT  LOCATION:  SLEEP CENTER                 FACILITY:  [facility]  PHYSICIAN:  Barbaraann Share, MD,FCCPDATE OF BIRTH:  1955-05-27  DATE OF STUDY:  08/27/2011                           NOCTURNAL POLYSOMNOGRAM  REFERRING PHYSICIAN:  Barbaraann Share, MD,FCCP  INDICATION FOR STUDY:  Hypersomnia with sleep apnea.  EPWORTH SLEEPINESS SCORE:  2.  MEDICATIONS:  SLEEP ARCHITECTURE:  The patient had a total sleep time of 271 minutes with no slow-wave sleep and only 14 minutes of REM.  Sleep onset latency was fairly rapid at 0.5 minutes and REM onset was very prolonged at 320 minutes.  Sleep efficiency was moderately reduced at 73%.  RESPIRATORY DATA:  The patient was found to have 28 apneas and 14 obstructive hypopneas, giving him an apnea-hypopnea index of 9 events per hour.  The events occurred in all body positions, and there was moderate snoring noted throughout.  OXYGEN DATA:  There were O2 desaturations as low as 88% with the patient's obstructive events.  CARDIAC DATA:  Rare PAC and PVC noted, but no clinically significant arrhythmias were seen.  MOVEMENT-PARASOMNIA:  The patient had no significant leg jerks or other abnormal behaviors noted during the study.  IMPRESSIONS-RECOMMENDATIONS: 1. Very mild obstructive sleep apnea/hypopnea syndrome with an AHI of     9 events per hour and transient O2 desaturation as low as 81%.     Treatment for this degree of sleep apnea can include a trial of     weight loss alone, upper airway surgery, dental appliance, and also      continuous positive airway pressure.  Clinical correlation is     suggested. 2. Rare premature atrial contraction and premature ventricular     contraction noted, but no clinically significant arrhythmias were     seen.     Barbaraann Share, MD,FCCP Diplomate, American Board of  Sleep Medicine    [facility]MED[facility]  D:  09/02/2011 16:44:47  T:  09/02/2011 22:26:15  Job:  [id number]

## 2011-11-10 ENCOUNTER — Ambulatory Visit (INDEPENDENT_AMBULATORY_CARE_PROVIDER_SITE_OTHER): Payer: Medicaid Other | Admitting: Pulmonary Disease

## 2011-11-10 ENCOUNTER — Other Ambulatory Visit: Payer: Self-pay | Admitting: Pulmonary Disease

## 2011-11-10 ENCOUNTER — Encounter: Payer: Self-pay | Admitting: Pulmonary Disease

## 2011-11-10 VITALS — BP 150/70 | HR 94 | Ht 67.5 in | Wt 235.8 lb

## 2011-11-10 DIAGNOSIS — G4733 Obstructive sleep apnea (adult) (pediatric): Secondary | ICD-10-CM

## 2011-11-10 DIAGNOSIS — Z23 Encounter for immunization: Secondary | ICD-10-CM

## 2011-11-10 NOTE — Progress Notes (Signed)
  Subjective:    Patient ID: [name], male    DOB: Mar 30, 1955, 56 y.o.   MRN: [id number]  HPI Patient comes in today for followup of his recent sleep study.  He was found to have mild obstructive sleep apnea with an AHI of 9 events per hour.  I have reviewed the study with him in detail, and answered all of his questions.   Review of Systems  Constitutional: Negative for fever and unexpected weight change.  HENT: Positive for congestion and rhinorrhea. Negative for ear pain, nosebleeds, sore throat, sneezing, trouble swallowing, dental problem, postnasal drip and sinus pressure.   Eyes: Negative for redness and itching.  Respiratory: Negative for cough, chest tightness, shortness of breath and wheezing.   Cardiovascular: Positive for leg swelling. Negative for palpitations.  Gastrointestinal: Negative for nausea and vomiting.  Genitourinary: Negative for dysuria.  Musculoskeletal: Positive for joint swelling.  Skin: Negative for rash.  Neurological: Positive for headaches.  Hematological: Does not bruise/bleed easily.  Psychiatric/Behavioral: Positive for dysphoric mood. The patient is nervous/anxious.        Objective:   Physical Exam Obese male in no acute distress Nose without purulence or discharge noted Chest clear Lower extremities with minimal edema, no cyanosis The patient appears sleepy, but is able to answer questions appropriately and moves all 4 extremities.       Assessment & Plan:

## 2011-11-10 NOTE — Assessment & Plan Note (Signed)
The patient has mild obstructive sleep apnea by his recent sleep study, and I have discussed the various treatment options with him.  It is unclear how much his insomnia may be disrupting his sleep, as well as chronic pain.  I also think his chronic pain medication is contributing to his daytime sleepiness more than anything else.  Treatment options for this can include a trial of weight loss or in, upper airway surgery, dental appliance, and also CPAP.  The patient would like to try CPAP to see if it will help his sleep, and I will arrange this through a DME.  I have also stressed to him the importance of weight loss.

## 2011-11-10 NOTE — Patient Instructions (Addendum)
Will get you started on cpap.  Please call if having issues with tolerance. Work on weight loss followup with me in 6 weeks.

## 2011-11-10 NOTE — Addendum Note (Signed)
Addended by: Orma Flaming D on: 11/10/2011 11:36 AM   Modules accepted: Orders

## 2011-12-22 ENCOUNTER — Ambulatory Visit: Payer: Medicaid Other | Admitting: Pulmonary Disease

## 2012-08-09 ENCOUNTER — Emergency Department (HOSPITAL_COMMUNITY): Payer: Medicaid Other

## 2012-08-09 ENCOUNTER — Inpatient Hospital Stay (HOSPITAL_COMMUNITY)
Admission: EM | Admit: 2012-08-09 | Discharge: 2012-08-11 | DRG: 313 | Disposition: A | Discharge status: Home / Self Care | Payer: Medicaid Other | Attending: Family Medicine | Admitting: Family Medicine

## 2012-08-09 ENCOUNTER — Encounter (HOSPITAL_COMMUNITY): Payer: Self-pay

## 2012-08-09 DIAGNOSIS — E119 Type 2 diabetes mellitus without complications: Secondary | ICD-10-CM | POA: Diagnosis present

## 2012-08-09 DIAGNOSIS — Z808 Family history of malignant neoplasm of other organs or systems: Secondary | ICD-10-CM

## 2012-08-09 DIAGNOSIS — I129 Hypertensive chronic kidney disease with stage 1 through stage 4 chronic kidney disease, or unspecified chronic kidney disease: Secondary | ICD-10-CM | POA: Diagnosis present

## 2012-08-09 DIAGNOSIS — R079 Chest pain, unspecified: Principal | ICD-10-CM | POA: Diagnosis present

## 2012-08-09 DIAGNOSIS — J45909 Unspecified asthma, uncomplicated: Secondary | ICD-10-CM | POA: Diagnosis present

## 2012-08-09 DIAGNOSIS — N19 Unspecified kidney failure: Secondary | ICD-10-CM

## 2012-08-09 DIAGNOSIS — I509 Heart failure, unspecified: Secondary | ICD-10-CM | POA: Diagnosis present

## 2012-08-09 DIAGNOSIS — I959 Hypotension, unspecified: Secondary | ICD-10-CM | POA: Diagnosis present

## 2012-08-09 DIAGNOSIS — M6282 Rhabdomyolysis: Secondary | ICD-10-CM | POA: Diagnosis present

## 2012-08-09 DIAGNOSIS — N179 Acute kidney failure, unspecified: Secondary | ICD-10-CM | POA: Diagnosis present

## 2012-08-09 DIAGNOSIS — E7800 Pure hypercholesterolemia, unspecified: Secondary | ICD-10-CM | POA: Diagnosis present

## 2012-08-09 DIAGNOSIS — N289 Disorder of kidney and ureter, unspecified: Secondary | ICD-10-CM | POA: Diagnosis present

## 2012-08-09 DIAGNOSIS — F17200 Nicotine dependence, unspecified, uncomplicated: Secondary | ICD-10-CM | POA: Diagnosis present

## 2012-08-09 DIAGNOSIS — Z6833 Body mass index (BMI) 33.0-33.9, adult: Secondary | ICD-10-CM

## 2012-08-09 DIAGNOSIS — N189 Chronic kidney disease, unspecified: Secondary | ICD-10-CM | POA: Diagnosis present

## 2012-08-09 DIAGNOSIS — R000 Tachycardia, unspecified: Secondary | ICD-10-CM | POA: Diagnosis present

## 2012-08-09 DIAGNOSIS — E785 Hyperlipidemia, unspecified: Secondary | ICD-10-CM | POA: Diagnosis present

## 2012-08-09 DIAGNOSIS — Z79899 Other long term (current) drug therapy: Secondary | ICD-10-CM

## 2012-08-09 DIAGNOSIS — E669 Obesity, unspecified: Secondary | ICD-10-CM | POA: Diagnosis present

## 2012-08-09 DIAGNOSIS — I5032 Chronic diastolic (congestive) heart failure: Secondary | ICD-10-CM | POA: Diagnosis present

## 2012-08-09 DIAGNOSIS — E871 Hypo-osmolality and hyponatremia: Secondary | ICD-10-CM | POA: Diagnosis present

## 2012-08-09 DIAGNOSIS — I2510 Atherosclerotic heart disease of native coronary artery without angina pectoris: Secondary | ICD-10-CM | POA: Diagnosis present

## 2012-08-09 DIAGNOSIS — Z9119 Patient's noncompliance with other medical treatment and regimen: Secondary | ICD-10-CM

## 2012-08-09 DIAGNOSIS — Z91199 Patient's noncompliance with other medical treatment and regimen due to unspecified reason: Secondary | ICD-10-CM

## 2012-08-09 DIAGNOSIS — G894 Chronic pain syndrome: Secondary | ICD-10-CM | POA: Diagnosis present

## 2012-08-09 DIAGNOSIS — I252 Old myocardial infarction: Secondary | ICD-10-CM

## 2012-08-09 DIAGNOSIS — I4891 Unspecified atrial fibrillation: Secondary | ICD-10-CM | POA: Diagnosis present

## 2012-08-09 LAB — BASIC METABOLIC PANEL
Chloride: 94 mEq/L — ABNORMAL LOW (ref 96–112)
GFR calc Af Amer: 18 mL/min — ABNORMAL LOW (ref >90)
Potassium: 5.1 mEq/L (ref 3.5–5.1)

## 2012-08-09 LAB — POCT I-STAT TROPONIN I: Troponin i, poc: 0.2 ng/mL (ref 0.00–0.08)

## 2012-08-09 LAB — GLUCOSE, CAPILLARY: Glucose-Capillary: 99 mg/dL (ref 70–99)

## 2012-08-09 LAB — MRSA PCR SCREENING: MRSA by PCR: NEGATIVE

## 2012-08-09 LAB — TROPONIN I: Troponin I: <0.3 ng/mL (ref <0.30)

## 2012-08-09 LAB — CG4 I-STAT (LACTIC ACID): Lactic Acid, Venous: 1.21 mmol/L (ref 0.5–2.2)

## 2012-08-09 LAB — CBC
Platelets: 376 10*3/uL (ref 150–400)
RDW: 14.3 % (ref 11.5–15.5)
WBC: 16.3 10*3/uL — ABNORMAL HIGH (ref 4.0–10.5)

## 2012-08-09 MED ORDER — HEPARIN BOLUS VIA INFUSION
4000.0000 [IU] | Freq: Once | INTRAVENOUS | Status: AC
  Administered 2012-08-09: 4000 [IU] via INTRAVENOUS

## 2012-08-09 MED ORDER — NITROGLYCERIN 0.4 MG SL SUBL: 0.4000 mg | SUBLINGUAL_TABLET | SUBLINGUAL | Status: DC | PRN

## 2012-08-09 MED ORDER — ALBUTEROL SULFATE HFA 108 (90 BASE) MCG/ACT IN AERS: 2.0000 | INHALATION_SPRAY | Freq: Four times a day (QID) | RESPIRATORY_TRACT | Status: DC | PRN

## 2012-08-09 MED ORDER — HEPARIN (PORCINE) IN NACL 100-0.45 UNIT/ML-% IJ SOLN
1500.0000 [IU]/h | INTRAMUSCULAR | Status: DC
  Administered 2012-08-09: 1200 [IU]/h via INTRAVENOUS
  Filled 2012-08-09: qty 250

## 2012-08-09 MED ORDER — HYDROMORPHONE HCL PF 1 MG/ML IJ SOLN: 0.5000 mg | INTRAMUSCULAR | Status: DC | PRN

## 2012-08-09 MED ORDER — OXYCODONE HCL 5 MG PO TABS
5.0000 mg | ORAL_TABLET | ORAL | Status: DC | PRN
  Administered 2012-08-09 – 2012-08-10 (×3): 5 mg via ORAL
  Filled 2012-08-09 (×3): qty 1

## 2012-08-09 MED ORDER — ZOLPIDEM TARTRATE 5 MG PO TABS: 5.0000 mg | ORAL_TABLET | Freq: Every evening | ORAL | Status: DC | PRN

## 2012-08-09 MED ORDER — NITROGLYCERIN IN D5W 200-5 MCG/ML-% IV SOLN
5.0000 ug/min | INTRAVENOUS | Status: DC
  Administered 2012-08-09: 5 ug/min via INTRAVENOUS
  Filled 2012-08-09: qty 250

## 2012-08-09 MED ORDER — ALPRAZOLAM 0.5 MG PO TABS
1.0000 mg | ORAL_TABLET | Freq: Every evening | ORAL | Status: DC | PRN
  Administered 2012-08-10: 1 mg via ORAL
  Filled 2012-08-09: qty 2

## 2012-08-09 MED ORDER — FLUTICASONE PROPIONATE HFA 44 MCG/ACT IN AERO
1.0000 | INHALATION_SPRAY | Freq: Two times a day (BID) | RESPIRATORY_TRACT | Status: DC
  Administered 2012-08-09 – 2012-08-11 (×4): 1 via RESPIRATORY_TRACT
  Filled 2012-08-09: qty 10.6

## 2012-08-09 MED ORDER — SODIUM CHLORIDE 0.9 % IJ SOLN: 3.0000 mL | INTRAMUSCULAR | Status: DC | PRN

## 2012-08-09 MED ORDER — MORPHINE SULFATE ER 100 MG PO TBCR
100.0000 mg | EXTENDED_RELEASE_TABLET | Freq: Two times a day (BID) | ORAL | Status: DC
  Administered 2012-08-09 – 2012-08-10 (×3): 100 mg via ORAL
  Filled 2012-08-09 (×3): qty 1

## 2012-08-09 MED ORDER — ASPIRIN 325 MG PO TABS
325.0000 mg | ORAL_TABLET | ORAL | Status: AC
  Administered 2012-08-09: 325 mg via ORAL
  Filled 2012-08-09: qty 1

## 2012-08-09 MED ORDER — TIZANIDINE HCL 4 MG PO CAPS: 4.0000 mg | ORAL_CAPSULE | Freq: Three times a day (TID) | ORAL | Status: DC

## 2012-08-09 MED ORDER — ESCITALOPRAM OXALATE 20 MG PO TABS
20.0000 mg | ORAL_TABLET | Freq: Every day | ORAL | Status: DC
Start: 2012-08-09 — End: 2012-08-11
  Administered 2012-08-09 – 2012-08-10 (×2): 20 mg via ORAL
  Filled 2012-08-09 (×3): qty 1

## 2012-08-09 MED ORDER — ISOSORBIDE MONONITRATE ER 30 MG PO TB24
30.0000 mg | ORAL_TABLET | Freq: Every day | ORAL | Status: DC
  Administered 2012-08-10: 30 mg via ORAL
  Filled 2012-08-09 (×2): qty 1

## 2012-08-09 MED ORDER — PANTOPRAZOLE SODIUM 40 MG PO TBEC
40.0000 mg | DELAYED_RELEASE_TABLET | Freq: Every day | ORAL | Status: DC
  Administered 2012-08-09 – 2012-08-10 (×2): 40 mg via ORAL
  Filled 2012-08-09 (×2): qty 1

## 2012-08-09 MED ORDER — GABAPENTIN 300 MG PO CAPS
300.0000 mg | ORAL_CAPSULE | Freq: Three times a day (TID) | ORAL | Status: DC
  Administered 2012-08-09 – 2012-08-10 (×4): 300 mg via ORAL
  Filled 2012-08-09 (×8): qty 1

## 2012-08-09 MED ORDER — ACETAMINOPHEN 325 MG PO TABS: 650.0000 mg | ORAL_TABLET | Freq: Four times a day (QID) | ORAL | Status: DC | PRN

## 2012-08-09 MED ORDER — TIZANIDINE HCL 4 MG PO TABS
4.0000 mg | ORAL_TABLET | Freq: Three times a day (TID) | ORAL | Status: DC
  Administered 2012-08-09 – 2012-08-10 (×4): 4 mg via ORAL
  Filled 2012-08-09 (×8): qty 1

## 2012-08-09 MED ORDER — MORPHINE SULFATE 4 MG/ML IJ SOLN: 4.0000 mg | Freq: Once | INTRAMUSCULAR | Status: DC

## 2012-08-09 MED ORDER — INSULIN ASPART 100 UNIT/ML ~~LOC~~ SOLN
0.0000 [IU] | Freq: Three times a day (TID) | SUBCUTANEOUS | Status: DC
  Administered 2012-08-10: 1 [IU] via SUBCUTANEOUS
  Administered 2012-08-10: 2 [IU] via SUBCUTANEOUS

## 2012-08-09 MED ORDER — SODIUM CHLORIDE 0.9 % IV SOLN
250.0000 mL | INTRAVENOUS | Status: DC | PRN
  Administered 2012-08-09: 250 mL via INTRAVENOUS

## 2012-08-09 MED ORDER — INSULIN ASPART 100 UNIT/ML ~~LOC~~ SOLN: 0.0000 [IU] | Freq: Every day | SUBCUTANEOUS | Status: DC

## 2012-08-09 MED ORDER — FENTANYL CITRATE 0.05 MG/ML IJ SOLN
50.0000 ug | Freq: Once | INTRAMUSCULAR | Status: AC
  Administered 2012-08-09: 50 ug via INTRAVENOUS
  Filled 2012-08-09: qty 2

## 2012-08-09 MED ORDER — SODIUM CHLORIDE 0.9 % IJ SOLN
3.0000 mL | Freq: Two times a day (BID) | INTRAMUSCULAR | Status: DC
  Administered 2012-08-09: 3 mL via INTRAVENOUS

## 2012-08-09 MED ORDER — ONDANSETRON HCL 4 MG/2ML IJ SOLN: 4.0000 mg | Freq: Four times a day (QID) | INTRAMUSCULAR | Status: DC | PRN

## 2012-08-09 MED ORDER — ACETAMINOPHEN 650 MG RE SUPP: 650.0000 mg | Freq: Four times a day (QID) | RECTAL | Status: DC | PRN

## 2012-08-09 MED ORDER — SODIUM CHLORIDE 0.9 % IV BOLUS (SEPSIS)
1000.0000 mL | INTRAVENOUS | Status: AC
  Administered 2012-08-09: 1000 mL via INTRAVENOUS

## 2012-08-09 MED ORDER — ONDANSETRON HCL 4 MG PO TABS: 4.0000 mg | ORAL_TABLET | Freq: Four times a day (QID) | ORAL | Status: DC | PRN

## 2012-08-09 MED ORDER — ALUM & MAG HYDROXIDE-SIMETH 200-200-20 MG/5ML PO SUSP: 30.0000 mL | Freq: Four times a day (QID) | ORAL | Status: DC | PRN

## 2012-08-09 MED ORDER — ASPIRIN EC 325 MG PO TBEC
325.0000 mg | DELAYED_RELEASE_TABLET | Freq: Every day | ORAL | Status: DC
  Administered 2012-08-09 – 2012-08-10 (×2): 325 mg via ORAL
  Filled 2012-08-09 (×2): qty 1

## 2012-08-09 NOTE — ED Notes (Signed)
RN and Victorino Dike PA at bedside- pt alert and oriented, unable to keep still while BP cuff inflating.

## 2012-08-09 NOTE — ED Provider Notes (Signed)
History     CSN: [id number]  Arrival date & time 08/09/12  1306   First MD Initiated Contact with Patient 08/09/12 1419      No chief complaint on file.   (Consider location/radiation/quality/duration/timing/severity/associated sxs/prior treatment) HPI  Patient is a 57 yo M PMHx significant for MI, HTN, DM, spinal cord injury presenting to the ED for worsening chronic neck and back pain, centralized CP w/o radiation, productive cough over the last two days. Patient states his spinal pain is constant, sharp, 10/10 and causing him difficulty walking, but denies any bladder or bowel incontinence. Pt states his CP is "not as bad as his back pain" but has been associated with diaphoresis. Pt is unable to quantify or qualify CP and associated symptoms. Pt is unable to remember who his cardiologist is, but states he has not seen one in 2-3 years.   Past Medical History  Diagnosis Date  . Hypertension   . Heart attack   . Asthma   . Diabetes mellitus     type 2  . Hypercholesteremia   . Spinal cord injury     pain managed by pain clinic    Past Surgical History  Procedure Laterality Date  . Splenectomy    . Back surgery      Family History  Problem Relation Age of Onset  . Allergies Father   . Allergies Sister     x3  . Asthma Mother   . Heart disease Mother   . Heart disease Father   . Clotting disorder Father   . Cancer Father     bone marrow    History  Substance Use Topics  . Smoking status: Current Every Day Smoker -- 0.50 packs/day for 46 years    Types: Cigarettes  . Smokeless tobacco: Not on file  . Alcohol Use: No      Review of Systems  Constitutional: Positive for chills and diaphoresis.  HENT: Positive for neck pain.   Eyes: Negative.   Respiratory: Positive for cough and chest tightness.   Cardiovascular: Positive for chest pain.  Gastrointestinal: Negative.   Genitourinary: Negative.   Musculoskeletal: Positive for back pain.  Skin: Negative.    Neurological: Positive for numbness.    Allergies  Meperidine and related  Home Medications   No current outpatient prescriptions on file.  BP 110/47  Pulse 77  Temp(Src) 98.9 F (37.2 C) (Oral)  Resp 14  Ht 5\' 8"  (1.727 m)  Wt 219 lb 12.8 oz (99.7 kg)  BMI 33.43 kg/m2  SpO2 95%  Physical Exam  Constitutional: He is oriented to person, place, and time. He appears well-developed and well-nourished. He appears distressed.  HENT:  Head: Normocephalic and atraumatic.  Mouth/Throat: Oropharynx is clear and moist.  Eyes: Conjunctivae are normal.  Neck: Normal range of motion. Neck supple.  Cardiovascular: Normal rate, regular rhythm, normal heart sounds and intact distal pulses.   Pulmonary/Chest: Effort normal. No respiratory distress. He has no wheezes. He has rales.  Abdominal: Soft. There is no tenderness.  Musculoskeletal: Normal range of motion. He exhibits no edema.  Neurological: He is alert and oriented to person, place, and time. No cranial nerve deficit.  Skin: Skin is warm. He is diaphoretic.    ED Course  Procedures (including critical care time)   Date: 08/09/2012  Rate: 74  Rhythm: normal sinus rhythm  QRS Axis: right  Intervals: normal  ST/T Wave abnormalities: normal  Conduction Disutrbances:none  Narrative Interpretation:   Old EKG Reviewed:  none available    Date: 08/09/2012 17:40:17  Rate: 80  Rhythm: sinus rhythm  QRS Axis: right  Intervals: borderline prolonged QT  ST/T Wave abnormalities: normal  Conduction Disutrbances:none  Narrative Interpretation:   Old EKG Reviewed: unchanged    Labs Reviewed  CBC - Abnormal; Notable for the following:    WBC 16.3 (*)    All other components within normal limits  BASIC METABOLIC PANEL - Abnormal; Notable for the following:    Chloride 94 (*)    BUN 28 (*)    Creatinine, Ser 4.00 (*)    GFR calc non Af Amer 15 (*)    GFR calc Af Amer 18 (*)    All other components within normal limits   TROPONIN I - Abnormal; Notable for the following:    Troponin I 0.32 (*)    All other components within normal limits  POCT I-STAT TROPONIN I - Abnormal; Notable for the following:    Troponin i, poc 0.20 (*)    All other components within normal limits  MRSA PCR SCREENING  CULTURE, BLOOD (ROUTINE X 2)  CULTURE, BLOOD (ROUTINE X 2)  TROPONIN I  GLUCOSE, CAPILLARY  BASIC METABOLIC PANEL  TROPONIN I  TROPONIN I  HEMOGLOBIN A1C  HEPARIN LEVEL (UNFRACTIONATED)  CG4 I-STAT (LACTIC ACID)   Dg Chest Portable 1 View  08/09/2012   *RADIOLOGY REPORT*  Clinical Data: Chest pain  PORTABLE CHEST - 1 VIEW  Comparison: 01/06/2010  Findings: Heart size is upper normal.  Negative for heart failure. Patchy density right medial lung base may represent pneumonia or atelectasis.  This is more prominent than on prior studies and could represent an acute finding.  Follow-up suggested.  No pleural effusion.  IMPRESSION: Mild patchy density right medial lung base, possible pneumonia. Follow-up two-view chest x-ray suggested.   Original Report Authenticated By: Janeece Riggers, M.D.     1. Renal failure   2. CAD (coronary artery disease)   3. Chest pain   4. Chronic diastolic CHF (congestive heart failure)       MDM  Concern for cardiac etiology of Chest Pain. Cardiology has been consulted and hospitalist will see patient in the ED for admit. Pt has been re-evaluated prior to consult and VSS, NAD, heart RRR,  lungs rales present. No acute abnormalities found on EKG and first two round of cardiac enzymes positive. Pt is better controlled prior to admission. This case was discussed with Dr. Ethelda Chick who has seen the patient and agrees with plan to admit. The patient appears reasonably stabilized for admission considering the current resources, flow, and capabilities available in the ED at this time, and I doubt any other [facility] requiring further screening and/or treatment in the ED prior to  admission.          Jeannetta Ellis, PA-C 08/10/12 [phone]

## 2012-08-09 NOTE — Progress Notes (Signed)
ANTICOAGULATION CONSULT NOTE - Initial Consult  Pharmacy Consult for heparin Indication: chest pain/ACS  Allergies  Allergen Reactions  . Meperidine And Related Itching    Patient Measurements:   Heparin Dosing Weight: 89.2kg  Vital Signs: Temp: 97.9 F (36.6 C) (06/13 1314) Temp src: Oral (06/13 1314) BP: 102/71 mmHg (06/13 2015) Pulse Rate: 87 (06/13 2015)  Labs:  Recent Labs  08/09/12 1354 08/09/12 1632  HGB 14.7  --   HCT 42.5  --   PLT 376  --   CREATININE 4.00*  --   TROPONINI  --  0.32*    The CrCl is unknown because both a height and weight (above a minimum accepted value) are required for this calculation.   Medical History: Past Medical History  Diagnosis Date  . Hypertension   . Heart attack   . Asthma   . Diabetes mellitus     type 2  . Hypercholesteremia   . Spinal cord injury     pain managed by pain clinic    Medications:  Infusions:  . sodium chloride    . heparin    . heparin    . nitroGLYCERIN      Assessment: 50 yof presented to the ED with chest pain. To start IV heparin for anticoagulation. Pt is not on any anticoagulants PTA. Baseline CBC is WNL. Of note, pts Scr is elevated. Dosing is based on patients stated height/weight of 68in/215lbs  Goal of Therapy:  Heparin level 0.3-0.7 units/ml Monitor platelets by anticoagulation protocol: Yes   Plan:  1. Heparin bolus 4000 units IV x 1 2. Heparin gtt 1200 units/hr 3. Check an 8 hour heparin level 4. Daily heparin level and CBC  Jonpaul Lumm, Drake Leach 08/09/2012,8:34 PM

## 2012-08-09 NOTE — ED Notes (Signed)
Dr. Bebe Shaggy made aware of pt blood pressure- sts start 1L bolus and recheck manual BP

## 2012-08-09 NOTE — ED Provider Notes (Signed)
Dr. Donnamae Jude patient. He requests hospitalist to admit patient. I spoke with Dr.Elmahi plan admit telemetry Results for orders placed during the hospital encounter of 08/09/12  CBC      Result Value Range   WBC 16.3 (*) 4.0 - 10.5 K/uL   RBC 4.52  4.22 - 5.81 MIL/uL   Hemoglobin 14.7  13.0 - 17.0 g/dL   HCT 82.9  56.2 - 13.0 %   MCV 94.0  78.0 - 100.0 fL   MCH 32.5  26.0 - 34.0 pg   MCHC 34.6  30.0 - 36.0 g/dL   RDW 86.5  78.4 - 69.6 %   Platelets 376  150 - 400 K/uL  BASIC METABOLIC PANEL      Result Value Range   Sodium 136  135 - 145 mEq/L   Potassium 5.1  3.5 - 5.1 mEq/L   Chloride 94 (*) 96 - 112 mEq/L   CO2 27  19 - 32 mEq/L   Glucose, Bld 90  70 - 99 mg/dL   BUN 28 (*) 6 - 23 mg/dL   Creatinine, Ser 2.95 (*) 0.50 - 1.35 mg/dL   Calcium 28.4  8.4 - 13.2 mg/dL   GFR calc non Af Amer 15 (*) >90 mL/min   GFR calc Af Amer 18 (*) >90 mL/min  TROPONIN I      Result Value Range   Troponin I 0.32 (*) <0.30 ng/mL  POCT I-STAT TROPONIN I      Result Value Range   Troponin i, poc 0.20 (*) 0.00 - 0.08 ng/mL   Comment NOTIFIED PHYSICIAN     Comment 3           CG4 I-STAT (LACTIC ACID)      Result Value Range   Lactic Acid, Venous 1.21  0.5 - 2.2 mmol/L   Dg Chest Portable 1 View  08/09/2012   *RADIOLOGY REPORT*  Clinical Data: Chest pain  PORTABLE CHEST - 1 VIEW  Comparison: 01/06/2010  Findings: Heart size is upper normal.  Negative for heart failure. Patchy density right medial lung base may represent pneumonia or atelectasis.  This is more prominent than on prior studies and could represent an acute finding.  Follow-up suggested.  No pleural effusion.  IMPRESSION: Mild patchy density right medial lung base, possible pneumonia. Follow-up two-view chest x-ray suggested.   Original Report Authenticated By: Janeece Riggers, M.D.    Diagnosis #1 back pain #2 chest pain #3 renal failure  Doug Sou, MD 08/09/12 4401

## 2012-08-09 NOTE — ED Notes (Addendum)
Floor unable to take nitro drip. Dr. Lovell Sheehan requesting step down bed. Flow manager made aware.

## 2012-08-09 NOTE — ED Provider Notes (Signed)
I screened patient due to abnormal troponin All patient reports his "hurting from my head now to my back" He is awake/alert He mentions CP in passing  EKG reviewed Rest of his labs/imaging pending    Date: 08/09/2012 1322  Rate: 74  Rhythm: normal sinus rhythm  QRS Axis: right  Intervals: normal  ST/T Wave abnormalities: nonspecific ST changes  Conduction Disutrbances:none  Narrative Interpretation:   Old EKG Reviewed: none available at time of interpretation    Joya Gaskins, MD 08/09/12 1420

## 2012-08-09 NOTE — Consult Note (Addendum)
CARDIOLOGY CONSULT NOTE  Patient ID: [name] MRN: [id number] DOB/AGE: [birth year] 57 y.o.  Admit date: 08/09/2012 Referring Physician  Doug Sou, MD  Primary Physician:  Dorrene German, MD Reason for Consultation  Abnormal troponins and chest pain  HPI: Patient who had seen twice, has known history of coronary artery disease and occluded right coronary artery and recent stress test about 8 months ago in the outpatient setting had revealed inferolateral scar with mild peri-infarct ischemia with a preserved ejection fraction. He is now limited to the hospital with severe back pain, leg pain and neck pain. He also complained of chest pain. Patient is chronic pain syndrome and is on chronic pain medications. Due to his underlying cardiovascular risk factors, a troponin was drawn which was mildly abnormal. I was asked to see the patient. Patient complains of sharp pain, states that his whole body is being stabbed. The pain starts from his foot to his lower back although there his neck. He also say she is chest pain. He is very uncomfortable and states that he is going through withdrawal from pain medications. She is chronic dyspnea and shortness of breath. Patient also has chronic back pain. He denies any fever, nausea, vomiting.  Past Medical History  Diagnosis Date  . Hypertension   . Heart attack   . Asthma   . Diabetes mellitus     type 2  . Hypercholesteremia   . Spinal cord injury     pain managed by pain clinic     Past Surgical History  Procedure Laterality Date  . Splenectomy    . Back surgery       Family History  Problem Relation Age of Onset  . Allergies Father   . Allergies Sister     x3  . Asthma Mother   . Heart disease Mother   . Heart disease Father   . Clotting disorder Father   . Cancer Father     bone marrow     Social History: History   Social History  . Marital Status: Divorced    Spouse Name: N/A    Number of Children: 1  . Years of  Education: N/A   Occupational History  . disabled    Social History Main Topics  . Smoking status: Current Every Day Smoker -- 0.50 packs/day for 46 years    Types: Cigarettes  . Smokeless tobacco: Not on file  . Alcohol Use: No  . Drug Use: No  . Sexually Active: Not on file   Other Topics Concern  . Not on file   Social History Narrative  . No narrative on file      (Not in a hospital admission)  Scheduled Meds:  Continuous Infusions:  PRN Meds:.nitroGLYCERIN  ROS: Patient denies any bowel or bladder disturbances. No recent weight changes.  no TIA at neurological deficits. He is not a diabetic.    Physical Exam: Blood pressure 96/54, pulse 76, temperature 97.9 F (36.6 C), temperature source Oral, resp. rate 18, SpO2 95.00%.   General appearance: alert, cooperative, appears older than stated age, no distress and moderately obese Back: symmetric, no curvature. ROM normal. No CVA tenderness. Lungs: clear to auscultation bilaterally Chest wall: no tenderness Heart: regular rate and rhythm, S1, S2 normal, no murmur, click, rub or gallop Abdomen: soft, non-tender; bowel sounds normal; no masses,  no organomegaly Extremities: extremities normal, atraumatic, no cyanosis or edema Pulses: 2+ and symmetric pedal pulses are absent. No acute arterial insufficiency. Foot warm and  capillary fill normal. Neurologic: Grossly normal  Labs:   Lab Results  Component Value Date   WBC 16.3* 08/09/2012   HGB 14.7 08/09/2012   HCT 42.5 08/09/2012   MCV 94.0 08/09/2012   PLT 376 08/09/2012    Recent Labs Lab 08/09/12 1354  NA 136  K 5.1  CL 94*  CO2 27  BUN 28*  CREATININE 4.00*  CALCIUM 10.0  GLUCOSE 90   Lab Results  Component Value Date   CKTOTAL 5370 RESULTS CONFIRMED BY MANUAL DILUTION* 01/06/2010   CKMB 117.8 CRITICAL VALUE NOTED.  VALUE IS CONSISTENT WITH PREVIOUSLY REPORTED AND CALLED VALUE.* 01/06/2010   TROPONINI 0.32* 08/09/2012    Lipid Panel     Component  Value Date/Time   CHOL  Value: 151        ATP III CLASSIFICATION:  <200     mg/dL   Desirable  161-096  mg/dL   Borderline High  >=045    mg/dL   High        40/98/1191 0402   TRIG 71 01/06/2010 0402   HDL 37* 01/06/2010 0402   CHOLHDL 4.1 01/06/2010 0402   VLDL 14 01/06/2010 0402   LDLCALC  Value: 100        Total Cholesterol/HDL:CHD Risk Coronary Heart Disease Risk Table                     Men   Women  1/2 Average Risk   3.4   3.3  Average Risk       5.0   4.4  2 X Average Risk   9.6   7.1  3 X Average Risk  23.4   11.0        Use the calculated Patient Ratio above and the CHD Risk Table to determine the patient's CHD Risk.        ATP III CLASSIFICATION (LDL):  <100     mg/dL   Optimal  478-295  mg/dL   Near or Above                    Optimal  130-159  mg/dL   Borderline  621-308  mg/dL   High  >657     mg/dL   Very High* 84/69/6295 0402    EKG:  normal sinus rhythm, left atrial enlargement, no evidence of ischemia. unchanged from previous tracings.    Radiology: Dg Chest Portable 1 View  08/09/2012   *RADIOLOGY REPORT*  Clinical Data: Chest pain  PORTABLE CHEST - 1 VIEW  Comparison: 01/06/2010  Findings: Heart size is upper normal.  Negative for heart failure. Patchy density right medial lung base may represent pneumonia or atelectasis.  This is more prominent than on prior studies and could represent an acute finding.  Follow-up suggested.  No pleural effusion.  IMPRESSION: Mild patchy density right medial lung base, possible pneumonia. Follow-up two-view chest x-ray suggested.   Original Report Authenticated By: Janeece Riggers, M.D.      ASSESSMENT AND PLAN:  1. Chronic pain syndrome 2. Borderline hypertension, could be due to dehydration 3. Acute renal failure. Patient at least by my records in my office in 2012, had normal serum creatinine.  4. Chronic back pain. The history does not suggest aortic dissection. His upper extremity pulses are bounding, femoral pulses are bounding and equal  bilaterally. There is no suggestion of aortic regurgitation. Chest x-ray does not reveal mediastinal widening. And the pain is radiating all the way from  his foot to his neck. There is no neurological deficits.  Recommendation: Abnormal troponins are probably due to acute renal insufficiency and probable chronic diastolic heart failure. I do not think this is acute coronary syndrome. At this point I do not think cardiac workup is indicated until medical issues are stabilized. I be happy to be available if cardiac issues were to arise. Discussed with Dr. Doug Sou.  His WBC count is mildly elevated,  chest x-ray shows Mild patchy density right medial lung base, possible pneumonia. Workup for sepsis may be indicated.   Pamella Pert, MD 08/09/2012, 6:20 PM [facility] Pager: [phone] Office: [phone] If no answer Cell [phone]

## 2012-08-09 NOTE — ED Notes (Signed)
PA made aware by [facility]

## 2012-08-09 NOTE — ED Notes (Signed)
Pt now reports he has had intermittent mid-sternal chest pain for the "last 4-5 days".  +shortness of breath.  -nausea.

## 2012-08-09 NOTE — ED Notes (Signed)
Attempted report 

## 2012-08-09 NOTE — ED Notes (Signed)
Alvira Philips (sister) [phone]

## 2012-08-09 NOTE — ED Notes (Addendum)
Pt presents with generalized back pain x 1 week.  Pt reports h/o spinal cord injury and that his legs will give out on him.  Pt reports falling last week from same.  Pt reports pain radiates down both legs with numbness to both legs that is intermittent.  Pt denies any urinary or fecal incontinence. Pt is very diaphoretic in triage.

## 2012-08-09 NOTE — ED Notes (Signed)
Pt appears uncomfortable but NAD. Pt mentating appropriately. Skim warm and dry. Pt speech- mumbling and has difficulty keeping still for BP reading. Manual pressure taken.

## 2012-08-09 NOTE — ED Notes (Signed)
1L NS bolus started- dr. Bebe Shaggy made aware.

## 2012-08-09 NOTE — ED Notes (Signed)
i-stat troponin results shown to Dr. Bebe Shaggy

## 2012-08-09 NOTE — H&P (Signed)
[facility] History and Physical  DQUAN CORTOPASSI ZOX:[id number] DOB: 1955/08/01 DOA: 08/09/2012  Referring physician: EDP PCP: Dorrene German, MD  Specialists:   Chief Complaint: Chest Pain  HPI: [name] is a 57 y.o. male with a history of CAD and DM2 who presents to the ED with complaints of  Intermittent chest pain for the past 5 days.  He has had nausea and SOB but denies having vomiting or diaphoresis.  He waevaluated in the ED and found to have an elevation in his troponin and Cardiology was consulted by the EDP and he was seen and evaluated by Dr. Yates Decamp, and referred for medical admission.      Review of Systems: The patient denies anorexia, fever, chills, headaches, weight loss,, vision loss, diplopia, dizziness, decreased hearing, rhinitis, hoarseness, chest pain, syncope, dyspnea on exertion, peripheral edema, balance deficits, cough, hemoptysis, abdominal pain, nausea, vomiting, diarrhea, constipation, hematemesis, melena, hematochezia, severe indigestion/heartburn, dysuria, hematuria, incontinence, muscle weakness, suspicious skin lesions, transient blindness, difficulty walking, depression, unusual weight change, abnormal bleeding, enlarged lymph nodes, angioedema, and breast masses.    Past Medical History  Diagnosis Date  . Hypertension   . Heart attack   . Asthma   . Diabetes mellitus     type 2  . Hypercholesteremia   . Spinal cord injury     pain managed by pain clinic    Past Surgical History  Procedure Laterality Date  . Splenectomy    . Back surgery      Prior to Admission medications   Medication Sig Start Date End Date Taking? Authorizing Provider  albuterol (PROVENTIL HFA;VENTOLIN HFA) 108 (90 BASE) MCG/ACT inhaler Inhale 2 puffs into the lungs every 6 (six) hours as needed.   Yes Historical Provider, MD  ALPRAZolam Prudy Feeler) 1 MG tablet Take 1 mg by mouth at bedtime as needed for sleep.    Yes Historical Provider, MD  beclomethasone (QVAR)  80 MCG/ACT inhaler Inhale 1 puff into the lungs as needed (shortness of breath).    Yes Historical Provider, MD  escitalopram (LEXAPRO) 20 MG tablet Take 20 mg by mouth daily.   Yes Historical Provider, MD  gabapentin (NEURONTIN) 300 MG capsule Take 300 mg by mouth 3 (three) times daily.   Yes Historical Provider, MD  metFORMIN (GLUCOPHAGE) 500 MG tablet Take 500 mg by mouth 2 (two) times daily with a meal.   Yes Historical Provider, MD  morphine (MS CONTIN) 100 MG 12 hr tablet Take 100 mg by mouth 2 (two) times daily.   Yes Historical Provider, MD  omeprazole (PRILOSEC) 20 MG capsule Take 20 mg by mouth daily.   Yes Historical Provider, MD  Oxycodone HCl 20 MG TABS Take 1 tablet by mouth every 8 (eight) hours.   Yes Historical Provider, MD  tiZANidine (ZANAFLEX) 4 MG capsule Take 4 mg by mouth 3 (three) times daily.   Yes Historical Provider, MD  glucose blood (ACCU-CHEK AVIVA PLUS) test strip 1 each by Other route as needed. Use as instructed    Historical Provider, MD  isosorbide mononitrate (IMDUR) 30 MG 24 hr tablet Take 30 mg by mouth daily.    Historical Provider, MD    Allergies  Allergen Reactions  . Meperidine And Related Itching    Social History:  reports that he has been smoking Cigarettes.  He has a 23 pack-year smoking history. He does not have any smokeless tobacco history on file. He reports that he does not drink alcohol or use illicit  drugs.     Family History  Problem Relation Age of Onset  . Allergies Father   . Allergies Sister     x3  . Asthma Mother   . Heart disease Mother   . Heart disease Father   . Clotting disorder Father   . Cancer Father     bone marrow    (be sure to complete)   Physical Exam:  GEN:  Pleasant Obese  57 y.o. Caucasian male  examined  and in no acute distress; cooperative with exam Filed Vitals:   08/09/12 1830 08/09/12 1845 08/09/12 1900 08/09/12 1915  BP: 107/74 92/56 100/62 96/66  Pulse: 85  84 88  Temp:      TempSrc:       Resp: 17 21 19 22   SpO2: 95%  92% 90%   Blood pressure 96/66, pulse 88, temperature 97.9 F (36.6 C), temperature source Oral, resp. rate 22, SpO2 90.00%. PSYCH: He is alert and oriented x4; does not appear anxious does not appear depressed; affect is normal HEENT: Normocephalic and Atraumatic, Mucous membranes pink; PERRLA; EOM intact; Fundi:  Benign;  No scleral icterus, Nares: Patent, Oropharynx: Clear, Edentulous;  Neck:  FROM, no cervical lymphadenopathy nor thyromegaly or carotid bruit; no JVD; Breasts:: Not examined CHEST WALL: No tenderness CHEST: Normal respiration, clear to auscultation bilaterally HEART: Regular rate and rhythm; no murmurs rubs or gallops BACK: No kyphosis or scoliosis; no CVA tenderness ABDOMEN: Positive Bowel Sounds, Obese, soft non-tender; no masses, no organomegaly.    Rectal Exam: Not done EXTREMITIES: No cyanosis, clubbing or edema; no ulcerations. Genitalia: not examined PULSES: 2+ and symmetric SKIN: Normal hydration no rash or ulceration CNS: Cranial nerves 2-12 grossly intact no focal neurologic deficit    Labs on Admission:  Basic Metabolic Panel:  Recent Labs Lab 08/09/12 1354  NA 136  K 5.1  CL 94*  CO2 27  GLUCOSE 90  BUN 28*  CREATININE 4.00*  CALCIUM 10.0   Liver Function Tests: No results found for this basename: AST, ALT, ALKPHOS, BILITOT, PROT, ALBUMIN,  in the last 168 hours No results found for this basename: LIPASE, AMYLASE,  in the last 168 hours No results found for this basename: AMMONIA,  in the last 168 hours CBC:  Recent Labs Lab 08/09/12 1354  WBC 16.3*  HGB 14.7  HCT 42.5  MCV 94.0  PLT 376   Cardiac Enzymes:  Recent Labs Lab 08/09/12 1632  TROPONINI 0.32*    BNP (last 3 results) No results found for this basename: PROBNP,  in the last 8760 hours CBG: No results found for this basename: GLUCAP,  in the last 168 hours  Radiological Exams on Admission:   EKG:  Normal Sinus Rhthym  NO acute S-T  changes.      Assessment/Plan Principal Problem:   Chest pain Active Problems:   Chronic diastolic CHF (congestive heart failure)   CAD (coronary artery disease)   Acute on chronic renal insufficiency   Diabetes mellitus   1.     Chest Pain/CAD-  Rule out ACS,  Cycle Troponins, IV NTG as blood pressure tolerates, IV Heparin drip ordered.  Seen by Cards (Dr. Jacinto Halim) in ED.     2.      Acute on Chronic Renal Insufficency- Monitor trend hold ACE inhiiotor Rx for now .     3.     Chronic Diastolic CHF-  Diuretic IV PRN as blood pressure tolerates.    4.  Diabetes Mellitis- Hold glucophage , SSI PRN, check HBA1C.    5.      DVT prophylaxis with Lovenox.       Code Status:   FULL CODE Family Communication:   No Family at Bedside Disposition Plan:    Return to Home  Time spent: 77 Minutes  Ron Parker [facility] Pager [phone]  If 7PM-7AM, please contact night-coverage [email or web address] Password [facility] 08/09/2012, 8:17 PM

## 2012-08-09 NOTE — ED Notes (Signed)
Pt BP taken manually in both arms, pt alert and talking. RN notified

## 2012-08-09 NOTE — ED Provider Notes (Signed)
Patient is an extremely vague historian Complains of chronic back pain and intermittent chest pain for the past 4-5 days with shortness of breath and nausea. Back pain is constant. Patient is followed in the pain clinic for chronic back pain.  Doug Sou, MD 08/09/12 1735

## 2012-08-10 LAB — HEPARIN LEVEL (UNFRACTIONATED)
Heparin Unfractionated: <0.1 IU/mL — ABNORMAL LOW (ref 0.30–0.70)
Heparin Unfractionated: 0.22 IU/mL — ABNORMAL LOW (ref 0.30–0.70)

## 2012-08-10 LAB — BASIC METABOLIC PANEL
CO2: 26 mEq/L (ref 19–32)
Calcium: 8.5 mg/dL (ref 8.4–10.5)
GFR calc non Af Amer: 30 mL/min — ABNORMAL LOW (ref >90)
Sodium: 133 mEq/L — ABNORMAL LOW (ref 135–145)

## 2012-08-10 LAB — GLUCOSE, CAPILLARY
Glucose-Capillary: 130 mg/dL — ABNORMAL HIGH (ref 70–99)
Glucose-Capillary: 113 mg/dL — ABNORMAL HIGH (ref 70–99)

## 2012-08-10 LAB — TROPONIN I: Troponin I: 0.41 ng/mL (ref <0.30)

## 2012-08-10 MED ORDER — HEPARIN BOLUS VIA INFUSION
3000.0000 [IU] | Freq: Once | INTRAVENOUS | Status: AC
  Administered 2012-08-10: 3000 [IU] via INTRAVENOUS
  Filled 2012-08-10: qty 3000

## 2012-08-10 MED ORDER — OXYCODONE HCL 5 MG PO TABS
5.0000 mg | ORAL_TABLET | ORAL | Status: DC | PRN
  Administered 2012-08-11: 10 mg via ORAL
  Filled 2012-08-10: qty 2

## 2012-08-10 MED ORDER — ALBUTEROL SULFATE HFA 108 (90 BASE) MCG/ACT IN AERS
2.0000 | INHALATION_SPRAY | RESPIRATORY_TRACT | Status: DC | PRN
  Filled 2012-08-10: qty 6.7

## 2012-08-10 MED ORDER — SODIUM CHLORIDE 0.9 % IV SOLN: 250.0000 mL | INTRAVENOUS | Status: DC | PRN

## 2012-08-10 MED ORDER — HEPARIN SODIUM (PORCINE) 5000 UNIT/ML IJ SOLN
5000.0000 [IU] | Freq: Three times a day (TID) | INTRAMUSCULAR | Status: DC
  Administered 2012-08-10 – 2012-08-11 (×3): 5000 [IU] via SUBCUTANEOUS
  Filled 2012-08-10 (×6): qty 1

## 2012-08-10 MED ORDER — FENTANYL CITRATE 0.05 MG/ML IJ SOLN
12.5000 ug | INTRAMUSCULAR | Status: DC | PRN
  Administered 2012-08-10: 25 ug via INTRAVENOUS
  Filled 2012-08-10: qty 2

## 2012-08-10 NOTE — Progress Notes (Signed)
Report from Night RN. Chart reviewed together. Handoff complete.Introductions complete. Will continue to monitor and advise attending as needed.   

## 2012-08-10 NOTE — Progress Notes (Addendum)
Subjective:  Patient complains of severe back pain. He was noncompliance. He states that he has not had any further chest pains.  Objective:  Vital Signs in the last 24 hours: Temp:  [97.9 F (36.6 C)-99.4 F (37.4 C)] 98.4 F (36.9 C) (06/14 0800) Pulse Rate:  [66-92] 82 (06/14 0200) Resp:  [9-26] 15 (06/14 0800) BP: (70-160)/(39-117) 111/71 mmHg (06/14 0800) SpO2:  [90 %-100 %] 97 % (06/14 0856) Weight:  [99.7 kg (219 lb 12.8 oz)] 99.7 kg (219 lb 12.8 oz) (06/13 2145)  Intake/Output from previous day: 06/13 0701 - 06/14 0700 In: 610.5 [P.O.:350; I.V.:260.5] Out: 500 [Urine:500]  Physical Exam: General appearance: alert, cooperative, appears older than stated age, no distress and moderately obese  Back: symmetric, no curvature. ROM normal. No CVA tenderness.  Lungs: clear to auscultation bilaterally  Chest wall: no tenderness  Heart: regular rate and rhythm, S1, S2 normal, no murmur, click, rub or gallop  Abdomen: soft, non-tender; bowel sounds normal; no masses, no organomegaly  Extremities: extremities normal, atraumatic, no cyanosis or edema  Pulses: 2+ and symmetric  pedal pulses are absent. No acute arterial insufficiency. Foot warm and capillary fill normal.  Neurologic: Grossly normal  Lab Results:  Recent Labs  08/09/12 1354  WBC 16.3*  HGB 14.7  PLT 376    Recent Labs  08/09/12 1354 08/10/12 0235  NA 136 133*  K 5.1 4.3  CL 94* 98  CO2 27 26  GLUCOSE 90 130*  BUN 28* 25*  CREATININE 4.00* 2.30*    Recent Labs  08/10/12 0226 08/10/12 0800  TROPONINI 0.41* 0.35*    Cardiac Panel (last 3 results)  Recent Labs  08/09/12 2047 08/10/12 0226 08/10/12 0800  CKTOTAL  --   --  8051*  CKMB  --   --  64.3*  TROPONINI <0.30 0.41* 0.35*  RELINDX  --   --  0.8   EKG: normal EKG, normal sinus rhythm, unchanged from previous tracings.   Assessment/Plan:  1. Chronic pain syndrome, mild elevation in serum troponin in the setting of acute renal  insufficiency on chronic renal failure probably induced by dehydration and hypotension. 2. Rhabdomyolysis, etiology unknown. No history of fall. Has chronic tachycardia. 3. Coronary artery disease, known occluded RCA, preserved ejection fraction. 4. Shortness of breath and dyspnea on exertion, chronic. Chronic diastolic heart failure. 5. Moderate obesity 6. Hypertension presently hypotensive 7. Hyperlipidemia. Not on therapy. Not sure when this got dropped by the patient. Check lipids and CMP tomorrow and if liver enzymes normal, consider statins challenge again at some point once rhabdomyolysis is resolved. Very difficult patient and hard to manage medications.  Recommendation: From cardiac standpoint continued IV hydration for now is indicated.  I do not think this is acute coronary syndrome. I be happy to follow the patient along with you or if cardiac issues arise. I have personally discuss the clinical scenario with Dr. Jetty Duhamel who is in agreement.  Marland Kitchen aspirin EC  325 mg Oral Daily  . escitalopram  20 mg Oral Daily  . fluticasone  1 puff Inhalation BID  . gabapentin  300 mg Oral TID  . heparin subcutaneous  5,000 Units Subcutaneous Q8H  . insulin aspart  0-5 Units Subcutaneous QHS  . insulin aspart  0-9 Units Subcutaneous TID WC  . isosorbide mononitrate  30 mg Oral Daily  . morphine  100 mg Oral BID  . pantoprazole  40 mg Oral Daily  . tiZANidine  4 mg Oral TID  Pamella Pert, M.D. 08/10/2012, 11:12 AM [facility] Pager: [phone] Office: [phone] If no answer: [phone]

## 2012-08-10 NOTE — Progress Notes (Signed)
UR Completed Ara Grandmaison Graves-Bigelow, RN,BSN [phone]  

## 2012-08-10 NOTE — Progress Notes (Signed)
CRITICAL VALUE ALERT  Critical value received:  ckmb 64.3   Date of notification:  08/10/2012   Time of notification:  10:35 AM   Critical value read back:yes  Nurse who received alert:  Desiree Lucy   MD notified (1st page):  McClung   Time of first page:  10:36 AM   MD notified (2nd page):  Time of second page:  Responding MD:  Sharon Seller  Time MD responded:  10:37 AM

## 2012-08-10 NOTE — Progress Notes (Signed)
ANTICOAGULATION CONSULT NOTE - Initial Consult  Pharmacy Consult for heparin Indication: chest pain/ACS  Allergies  Allergen Reactions  . Meperidine And Related Itching    Patient Measurements: Height: 5\' 8"  (172.7 cm) Weight: 219 lb 12.8 oz (99.7 kg) IBW/kg (Calculated) : 68.4 Heparin Dosing Weight: 89.2kg  Vital Signs: Temp: 98.9 F (37.2 C) (06/14 0000) Temp src: Oral (06/14 0000) BP: 115/44 mmHg (06/14 0202) Pulse Rate: 82 (06/14 0200)  Labs:  Recent Labs  08/09/12 1354 08/09/12 1632 08/09/12 2047 08/10/12 0235  HGB 14.7  --   --   --   HCT 42.5  --   --   --   PLT 376  --   --   --   HEPARINUNFRC  --   --   --  <0.10*  CREATININE 4.00*  --   --   --   TROPONINI  --  0.32* <0.30  --     Estimated Creatinine Clearance: 23.3 ml/min (by C-G formula based on Cr of 4).  Medications:  Infusions:  . heparin 1,200 Units/hr (08/09/12 2051)  . nitroGLYCERIN Stopped (08/09/12 2300)    Assessment: 57 y.o. male on heparin for r/o ACS. Heparin level undetectable on 1200 units/hr. No bleeding noted. No issues with line per RN.  Goal of Therapy:  Heparin level 0.3-0.7 units/ml Monitor platelets by anticoagulation protocol: Yes   Plan:  1. Rebolus Heparin 3000 units IV x 1 2. Increase Heparin gtt to 1500 units/hr 3. Check an 8 hour heparin level  Christoper Fabian, PharmD, BCPS Clinical pharmacist, pager [phone] 08/10/2012,3:38 AM

## 2012-08-10 NOTE — ED Provider Notes (Signed)
Medical screening examination/treatment/procedure(s) were conducted as a shared visit with non-physician practitioner(s) and myself.  I personally evaluated the patient during the encounter  Doug Sou, MD 08/10/12 [phone]

## 2012-08-10 NOTE — Progress Notes (Signed)
[facility] Progress Note [place] TEAM 1 - Stepdown/ICU TEAM   [name] WUJ:[id number] DOB: 06-10-1955 DOA: 08/09/2012 PCP: Dorrene German, MD  Brief narrative: 57 y.o. male with a history of CAD and DM2 who presented to the ED with complaints of intermittent chest pain for 5 days. He had nausea and SOB but denied having vomiting or diaphoresis. He was evaluated in the ED and found to have an elevation in his troponin and Cardiology was consulted by the EDP.  He was evaluated by Dr. Yates Decamp, and referred for medical admission.   Assessment/Plan:  Chest pain / elevated troponins Trop elevation likely due to renal insuff - trop improving with volume - CK pending - no acute EKG changes - d/c heparin gtt - cont imdur - f/u EKG in AM - further eval as indicated per Cards (suspect no further intpt w/u will be required)  Mild hypotension / dehydration / mild hyponatremia Hydrate and follow clinically - recheck BMET in AM  Known CAD - occluded RCA S/p stress test ~37months ago revealing inferolateral scar with mild peri-infarct ischemia with a preserved ejection fraction  Hx of spinal cord injury w/ chronic pain syndrome  Continue usual home pain med regimen  Acute on chronic renal insufficiency crt at presentation markedly elevated at 4.0 - improving with volume expansion - baseline is difficult to assess as pt has shown wide variation on prior crt levels (0.76 - 5.3) - this could be an indication of RAS - will need to consider outpt evaluation once stable   Chronic diastolic CHF  Unable to quantify due to lack of recent echo in EPIC - follow clinically - no evidence of volume overload at this time   Diabetes mellitus 2 Well controlled at the present time - A1c pending   Code Status: FULL Family Communication: Spoke with patient directly Disposition Plan: Transfer to telemetry bed  Consultants: Cardiology - Dr. Jacinto Halim  Procedures: None  Antibiotics: None  DVT  prophylaxis: Lovenox  HPI/Subjective: The patient complains of ongoing low back pain.  He denies any further chest pain.  He denies shortness of breath fevers chills nausea or vomiting.  Objective: Blood pressure 111/71, pulse 82, temperature 98.4 F (36.9 C), temperature source Oral, resp. rate 15, height 5\' 8"  (1.727 m), weight 99.7 kg (219 lb 12.8 oz), SpO2 97.00%.  Intake/Output Summary (Last 24 hours) at 08/10/12 0905 Last data filed at 08/10/12 0700  Gross per 24 hour  Intake  610.5 ml  Output    500 ml  Net  110.5 ml   Exam: General: No acute respiratory distress Lungs: Clear to auscultation bilaterally without wheezes or crackles Cardiovascular: Regular rate and rhythm without murmur gallop or rub normal S1 and S2 - heart sounds somewhat distant Abdomen: Nontender, nondistended, soft, bowel sounds positive, no rebound, no ascites, no appreciable mass Extremities: No significant cyanosis, clubbing, or edema bilateral lower extremities  Data Reviewed: Basic Metabolic Panel:  Recent Labs Lab 08/09/12 1354 08/10/12 0235  NA 136 133*  K 5.1 4.3  CL 94* 98  CO2 27 26  GLUCOSE 90 130*  BUN 28* 25*  CREATININE 4.00* 2.30*  CALCIUM 10.0 8.5   Liver Function Tests: No results found for this basename: AST, ALT, ALKPHOS, BILITOT, PROT, ALBUMIN,  in the last 168 hours No results found for this basename: LIPASE, AMYLASE,  in the last 168 hours No results found for this basename: AMMONIA,  in the last 168 hours CBC:  Recent Labs Lab  08/09/12 1354  WBC 16.3*  HGB 14.7  HCT 42.5  MCV 94.0  PLT 376   Cardiac Enzymes:  Recent Labs Lab 08/09/12 1632 08/09/12 2047 08/10/12 0226 08/10/12 0800  TROPONINI 0.32* <0.30 0.41* 0.35*   CBG:  Recent Labs Lab 08/09/12 2139 08/10/12 0806  GLUCAP 99 111*    Recent Results (from the past 240 hour(s))  CULTURE, BLOOD (ROUTINE X 2)     Status: None   Collection Time    08/09/12  3:50 PM      Result Value Range Status    Specimen Description BLOOD HAND LEFT   Final   Special Requests BOTTLES DRAWN AEROBIC ONLY 10CC   Final   Culture  Setup Time 08/09/2012 22:26   Final   Culture     Final   Value:        BLOOD CULTURE RECEIVED NO GROWTH TO DATE CULTURE WILL BE HELD FOR 5 DAYS BEFORE ISSUING A FINAL NEGATIVE REPORT   Report Status PENDING   Incomplete  CULTURE, BLOOD (ROUTINE X 2)     Status: None   Collection Time    08/09/12  4:00 PM      Result Value Range Status   Specimen Description BLOOD ARM LEFT   Final   Special Requests BOTTLES DRAWN AEROBIC AND ANAEROBIC 8CC   Final   Culture  Setup Time 08/09/2012 22:26   Final   Culture     Final   Value:        BLOOD CULTURE RECEIVED NO GROWTH TO DATE CULTURE WILL BE HELD FOR 5 DAYS BEFORE ISSUING A FINAL NEGATIVE REPORT   Report Status PENDING   Incomplete  MRSA PCR SCREENING     Status: None   Collection Time    08/09/12  9:38 PM      Result Value Range Status   MRSA by PCR NEGATIVE  NEGATIVE Final   Comment:            The GeneXpert MRSA Assay (FDA     approved for NASAL specimens     only), is one component of a     comprehensive MRSA colonization     surveillance program. It is not     intended to diagnose MRSA     infection nor to guide or     monitor treatment for     MRSA infections.     Studies:  Recent x-ray studies have been reviewed in detail by the Attending Physician  Scheduled Meds:  Scheduled Meds: . aspirin EC  325 mg Oral Daily  . escitalopram  20 mg Oral Daily  . fluticasone  1 puff Inhalation BID  . gabapentin  300 mg Oral TID  . insulin aspart  0-5 Units Subcutaneous QHS  . insulin aspart  0-9 Units Subcutaneous TID WC  . isosorbide mononitrate  30 mg Oral Daily  . morphine  100 mg Oral BID  . pantoprazole  40 mg Oral Daily  . sodium chloride  3 mL Intravenous Q12H  . tiZANidine  4 mg Oral TID    Time spent on care of this patient: 35 minutes   [facility]  [facility] Office   [phone] Pager - Text Page per Loretha Stapler as per below:  On-Call/Text Page:      [email or web address]      password [email or web address]  If 7PM-7AM, please contact night-coverage [email or web address] Password [email or web address] 08/10/2012, 9:05 AM   LOS: 1 day

## 2012-08-11 LAB — COMPREHENSIVE METABOLIC PANEL
ALT: 40 U/L (ref 0–53)
Calcium: 8.8 mg/dL (ref 8.4–10.5)
Creatinine, Ser: 1.05 mg/dL (ref 0.50–1.35)
GFR calc Af Amer: 89 mL/min — ABNORMAL LOW (ref >90)
Glucose, Bld: 108 mg/dL — ABNORMAL HIGH (ref 70–99)
Sodium: 135 mEq/L (ref 135–145)
Total Protein: 6.7 g/dL (ref 6.0–8.3)

## 2012-08-11 LAB — CK TOTAL AND CKMB (NOT AT ARMC)
CK, MB: 21.6 ng/mL (ref 0.3–4.0)
Relative Index: 0.5 (ref 0.0–2.5)
Total CK: 3992 U/L — ABNORMAL HIGH (ref 7–232)

## 2012-08-11 LAB — LIPID PANEL
Cholesterol: 149 mg/dL (ref 0–200)
LDL Cholesterol: 98 mg/dL (ref 0–99)
VLDL: 27 mg/dL (ref 0–40)

## 2012-08-11 LAB — CBC
Hemoglobin: 12.7 g/dL — ABNORMAL LOW (ref 13.0–17.0)
MCH: 31.7 pg (ref 26.0–34.0)
MCHC: 33.8 g/dL (ref 30.0–36.0)
Platelets: 361 10*3/uL (ref 150–400)

## 2012-08-11 LAB — GLUCOSE, CAPILLARY: Glucose-Capillary: 87 mg/dL (ref 70–99)

## 2012-08-11 MED ORDER — ASPIRIN 325 MG PO TBEC: 325.0000 mg | DELAYED_RELEASE_TABLET | Freq: Every day | ORAL | Status: DC

## 2012-08-11 NOTE — Progress Notes (Signed)
Pt d/c'd today. D/c instructions reviewed with pt, copy of instructions given to pt. No written scripts to give. Pt d/c'd with belongings, declined wheelchair. Steady gait.

## 2012-08-11 NOTE — Discharge Summary (Signed)
Physician Discharge Summary  [name] ZOX:[id number] DOB: May 11, 1955 DOA: 08/09/2012  PCP: Dorrene German, MD  Admit date: 08/09/2012 Discharge date: 08/11/2012  Time spent: 35 minutes  Recommendations for Outpatient Follow-up:  1. Recommend outpatient risk stratification 2. Recommend outpatient renal angiogram to rule out renal artery stenosis 3. Recommend follow up with pain management as an outpatient  Discharge Diagnoses:  Principal Problem:   Chest pain Active Problems:   Acute on chronic renal insufficiency   Chronic diastolic CHF (congestive heart failure)   Diabetes mellitus   CAD (coronary artery disease)   Discharge Condition: Fair  Diet recommendation: Heart healthy low-salt  Filed Weights   08/09/12 2145 08/11/12 0529  Weight: 99.7 kg (219 lb 12.8 oz) 100.245 kg (221 lb)    History of present illness:  57 y.o. male with a history of CAD., Paroxysmal atrial fibrillation and DM2 who presented to the ED with complaints of intermittent chest pain for 5 days. He had nausea and SOB but denied having vomiting or diaphoresis. He was evaluated in the ED and found to have an elevation in his troponin and Cardiology was consulted by the EDP. He was evaluated by Dr. Yates Decamp, and referred for medical admission.  Hospital stay it was thought by cardiology that elevated troponins were more likely secondary to his acute renal insufficiency and heparin never been started emergency room was discontinued-his creatinine on admission was 4.00 and it was thought that he might benefit from an outpatient renal angiogram to rule out renal artery stenosis as this has occurred before He has chronic pain syndrome status post spinal cord injury and he is medications. His A1c on admission 5.7 he'll need further outpatient stratification He was ready for discharge on 08/11/2012 instructed to follow closely with outpatient physician  Discharge Exam: Filed Vitals:   08/10/12 1938 08/10/12  2100 08/11/12 0529 08/11/12 0804  BP:  138/81 149/67   Pulse:  70 60   Temp:  98.2 F (36.8 C) 97.9 F (36.6 C)   TempSrc:  Oral Oral   Resp:  18 18   Height:      Weight:   100.245 kg (221 lb)   SpO2: 96% 100% 98% 98%   Alert pleasant oriented  General: NAD NCAT Cardiovascular: S1-S2 no murmur rub or gallop Respiratory: Clinically clear  Discharge Instructions  Discharge Orders   Future Orders Complete By Expires     Diet - low sodium heart healthy  As directed     Discharge instructions  As directed     Comments:      You were cared for by a hospitalist during your hospital stay. If you have any questions about your discharge medications or the care you received while you were in the hospital after you are discharged, you can call the unit and asked to speak with the hospitalist on call if the hospitalist that took care of you is not available. Once you are discharged, your primary care physician will handle any further medical issues. Please note that NO REFILLS for any discharge medications will be authorized once you are discharged, as it is imperative that you return to your primary care physician (or establish a relationship with a primary care physician if you do not have one) for your aftercare needs so that they can reassess your need for medications and monitor your lab values. If you do not have a primary care physician, you can call [phone] for a physician referral.    Increase activity  slowly  As directed         Medication List    TAKE these medications       ACCU-CHEK AVIVA PLUS test strip  Generic drug:  glucose blood  1 each by Other route as needed. Use as instructed     albuterol 108 (90 BASE) MCG/ACT inhaler  Commonly known as:  PROVENTIL HFA;VENTOLIN HFA  Inhale 2 puffs into the lungs every 6 (six) hours as needed.     ALPRAZolam 1 MG tablet  Commonly known as:  XANAX  Take 1 mg by mouth at bedtime as needed for sleep.     aspirin 325 MG EC tablet   Take 1 tablet (325 mg total) by mouth daily.     beclomethasone 80 MCG/ACT inhaler  Commonly known as:  QVAR  Inhale 1 puff into the lungs as needed (shortness of breath).     escitalopram 20 MG tablet  Commonly known as:  LEXAPRO  Take 20 mg by mouth daily.     gabapentin 300 MG capsule  Commonly known as:  NEURONTIN  Take 300 mg by mouth 3 (three) times daily.     isosorbide mononitrate 30 MG 24 hr tablet  Commonly known as:  IMDUR  Take 30 mg by mouth daily.     metFORMIN 500 MG tablet  Commonly known as:  GLUCOPHAGE  Take 500 mg by mouth 2 (two) times daily with a meal.     morphine 100 MG 12 hr tablet  Commonly known as:  MS CONTIN  Take 100 mg by mouth 2 (two) times daily.     omeprazole 20 MG capsule  Commonly known as:  PRILOSEC  Take 20 mg by mouth daily.     Oxycodone HCl 20 MG Tabs  Take 1 tablet by mouth every 8 (eight) hours.     tiZANidine 4 MG capsule  Commonly known as:  ZANAFLEX  Take 4 mg by mouth 3 (three) times daily.       Allergies  Allergen Reactions  . Meperidine And Related Itching      The results of significant diagnostics from this hospitalization (including imaging, microbiology, ancillary and laboratory) are listed below for reference.    Significant Diagnostic Studies: Dg Chest Portable 1 View  08/09/2012   *RADIOLOGY REPORT*  Clinical Data: Chest pain  PORTABLE CHEST - 1 VIEW  Comparison: 01/06/2010  Findings: Heart size is upper normal.  Negative for heart failure. Patchy density right medial lung base may represent pneumonia or atelectasis.  This is more prominent than on prior studies and could represent an acute finding.  Follow-up suggested.  No pleural effusion.  IMPRESSION: Mild patchy density right medial lung base, possible pneumonia. Follow-up two-view chest x-ray suggested.   Original Report Authenticated By: Janeece Riggers, M.D.    Microbiology: Recent Results (from the past 240 hour(s))  CULTURE, BLOOD (ROUTINE X 2)      Status: None   Collection Time    08/09/12  3:50 PM      Result Value Range Status   Specimen Description BLOOD HAND LEFT   Final   Special Requests BOTTLES DRAWN AEROBIC ONLY 10CC   Final   Culture  Setup Time 08/09/2012 22:26   Final   Culture     Final   Value:        BLOOD CULTURE RECEIVED NO GROWTH TO DATE CULTURE WILL BE HELD FOR 5 DAYS BEFORE ISSUING A FINAL NEGATIVE REPORT   Report Status PENDING  Incomplete  CULTURE, BLOOD (ROUTINE X 2)     Status: None   Collection Time    08/09/12  4:00 PM      Result Value Range Status   Specimen Description BLOOD ARM LEFT   Final   Special Requests BOTTLES DRAWN AEROBIC AND ANAEROBIC 8CC   Final   Culture  Setup Time 08/09/2012 22:26   Final   Culture     Final   Value:        BLOOD CULTURE RECEIVED NO GROWTH TO DATE CULTURE WILL BE HELD FOR 5 DAYS BEFORE ISSUING A FINAL NEGATIVE REPORT   Report Status PENDING   Incomplete  MRSA PCR SCREENING     Status: None   Collection Time    08/09/12  9:38 PM      Result Value Range Status   MRSA by PCR NEGATIVE  NEGATIVE Final   Comment:            The GeneXpert MRSA Assay (FDA     approved for NASAL specimens     only), is one component of a     comprehensive MRSA colonization     surveillance program. It is not     intended to diagnose MRSA     infection nor to guide or     monitor treatment for     MRSA infections.     Labs: Basic Metabolic Panel:  Recent Labs Lab 08/09/12 1354 08/10/12 0235 08/11/12 0435  NA 136 133* 135  K 5.1 4.3 4.5  CL 94* 98 100  CO2 27 26 27   GLUCOSE 90 130* 108*  BUN 28* 25* 18  CREATININE 4.00* 2.30* 1.05  CALCIUM 10.0 8.5 8.8   Liver Function Tests:  Recent Labs Lab 08/11/12 0435  AST 113*  ALT 40  ALKPHOS 61  BILITOT 0.3  PROT 6.7  ALBUMIN 3.0*   No results found for this basename: LIPASE, AMYLASE,  in the last 168 hours No results found for this basename: AMMONIA,  in the last 168 hours CBC:  Recent Labs Lab 08/09/12 1354  08/11/12 0435  WBC 16.3* 10.7*  HGB 14.7 12.7*  HCT 42.5 37.6*  MCV 94.0 93.8  PLT 376 361   Cardiac Enzymes:  Recent Labs Lab 08/09/12 1632 08/09/12 2047 08/10/12 0226 08/10/12 0800 08/11/12 0435  CKTOTAL  --   --   --  8051* 3992*  CKMB  --   --   --  64.3* 21.6*  TROPONINI 0.32* <0.30 0.41* 0.35* <0.30   BNP: BNP (last 3 results) No results found for this basename: PROBNP,  in the last 8760 hours CBG:  Recent Labs Lab 08/10/12 0806 08/10/12 1139 08/10/12 1648 08/10/12 2111 08/11/12 0739  GLUCAP 111* 130* 137* 113* 87       Signed:  Edgel Degnan, [id number]  [facility] 08/11/2012, 8:30 AM

## 2012-08-15 LAB — CULTURE, BLOOD (ROUTINE X 2): Culture: NO GROWTH

## 2012-12-24 ENCOUNTER — Encounter (HOSPITAL_COMMUNITY)
Admission: RE | Disposition: A | Discharge status: Home / Self Care | Payer: Self-pay | Source: Ambulatory Visit | Attending: Cardiology

## 2012-12-24 ENCOUNTER — Ambulatory Visit (HOSPITAL_COMMUNITY)
Admission: RE | Admit: 2012-12-24 | Discharge: 2012-12-24 | Disposition: A | Discharge status: Home / Self Care | Payer: Medicaid Other | Source: Ambulatory Visit | Attending: Cardiology | Admitting: Cardiology

## 2012-12-24 DIAGNOSIS — G894 Chronic pain syndrome: Secondary | ICD-10-CM | POA: Insufficient documentation

## 2012-12-24 DIAGNOSIS — E119 Type 2 diabetes mellitus without complications: Secondary | ICD-10-CM | POA: Insufficient documentation

## 2012-12-24 DIAGNOSIS — F17200 Nicotine dependence, unspecified, uncomplicated: Secondary | ICD-10-CM | POA: Insufficient documentation

## 2012-12-24 DIAGNOSIS — I2510 Atherosclerotic heart disease of native coronary artery without angina pectoris: Secondary | ICD-10-CM | POA: Insufficient documentation

## 2012-12-24 DIAGNOSIS — E785 Hyperlipidemia, unspecified: Secondary | ICD-10-CM | POA: Insufficient documentation

## 2012-12-24 DIAGNOSIS — I252 Old myocardial infarction: Secondary | ICD-10-CM | POA: Insufficient documentation

## 2012-12-24 DIAGNOSIS — Z7982 Long term (current) use of aspirin: Secondary | ICD-10-CM | POA: Insufficient documentation

## 2012-12-24 DIAGNOSIS — I10 Essential (primary) hypertension: Secondary | ICD-10-CM | POA: Insufficient documentation

## 2012-12-24 DIAGNOSIS — R079 Chest pain, unspecified: Secondary | ICD-10-CM | POA: Insufficient documentation

## 2012-12-24 DIAGNOSIS — Z79899 Other long term (current) drug therapy: Secondary | ICD-10-CM | POA: Insufficient documentation

## 2012-12-24 DIAGNOSIS — E782 Mixed hyperlipidemia: Secondary | ICD-10-CM | POA: Insufficient documentation

## 2012-12-24 HISTORY — PX: LEFT HEART CATHETERIZATION WITH CORONARY ANGIOGRAM: SHX5451

## 2012-12-24 LAB — GLUCOSE, CAPILLARY
Glucose-Capillary: 100 mg/dL — ABNORMAL HIGH (ref 70–99)
Glucose-Capillary: 110 mg/dL — ABNORMAL HIGH (ref 70–99)

## 2012-12-24 SURGERY — LEFT HEART CATHETERIZATION WITH CORONARY ANGIOGRAM
Anesthesia: LOCAL

## 2012-12-24 MED ORDER — ACETAMINOPHEN 325 MG PO TABS: 650.0000 mg | ORAL_TABLET | ORAL | Status: DC | PRN

## 2012-12-24 MED ORDER — MIDAZOLAM HCL 2 MG/2ML IJ SOLN
INTRAMUSCULAR | Status: AC
  Filled 2012-12-24: qty 2

## 2012-12-24 MED ORDER — HEPARIN (PORCINE) IN NACL 2-0.9 UNIT/ML-% IJ SOLN
INTRAMUSCULAR | Status: AC
  Filled 2012-12-24: qty 1000

## 2012-12-24 MED ORDER — VERAPAMIL HCL 2.5 MG/ML IV SOLN
INTRAVENOUS | Status: AC
  Filled 2012-12-24: qty 2

## 2012-12-24 MED ORDER — OXYCODONE-ACETAMINOPHEN 5-325 MG PO TABS: 1.0000 | ORAL_TABLET | ORAL | Status: DC | PRN

## 2012-12-24 MED ORDER — SODIUM CHLORIDE 0.9 % IV SOLN: 250.0000 mL | INTRAVENOUS | Status: DC | PRN

## 2012-12-24 MED ORDER — SODIUM CHLORIDE 0.9 % IV SOLN: 1.0000 mL/kg/h | INTRAVENOUS | Status: DC

## 2012-12-24 MED ORDER — SODIUM CHLORIDE 0.9 % IV SOLN
INTRAVENOUS | Status: DC
  Administered 2012-12-24: 07:00:00 via INTRAVENOUS

## 2012-12-24 MED ORDER — LIDOCAINE HCL (PF) 1 % IJ SOLN
INTRAMUSCULAR | Status: AC
  Filled 2012-12-24: qty 30

## 2012-12-24 MED ORDER — ASPIRIN 81 MG PO CHEW
CHEWABLE_TABLET | ORAL | Status: AC
  Filled 2012-12-24: qty 1

## 2012-12-24 MED ORDER — ASPIRIN 81 MG PO CHEW
81.0000 mg | CHEWABLE_TABLET | ORAL | Status: AC
  Administered 2012-12-24: 81 mg via ORAL

## 2012-12-24 MED ORDER — SODIUM CHLORIDE 0.9 % IV BOLUS (SEPSIS)
500.0000 mL | Freq: Once | INTRAVENOUS | Status: AC
  Administered 2012-12-24: 07:00:00 via INTRAVENOUS

## 2012-12-24 MED ORDER — NITROGLYCERIN 0.2 MG/ML ON CALL CATH LAB
INTRAVENOUS | Status: AC
  Filled 2012-12-24: qty 1

## 2012-12-24 MED ORDER — ONDANSETRON HCL 4 MG/2ML IJ SOLN: 4.0000 mg | Freq: Four times a day (QID) | INTRAMUSCULAR | Status: DC | PRN

## 2012-12-24 MED ORDER — METFORMIN HCL 500 MG PO TABS: 500.0000 mg | ORAL_TABLET | Freq: Two times a day (BID) | ORAL | Status: AC

## 2012-12-24 MED ORDER — SODIUM CHLORIDE 0.9 % IJ SOLN: 3.0000 mL | Freq: Two times a day (BID) | INTRAMUSCULAR | Status: DC

## 2012-12-24 MED ORDER — SODIUM CHLORIDE 0.9 % IJ SOLN: 3.0000 mL | INTRAMUSCULAR | Status: DC | PRN

## 2012-12-24 MED ORDER — OXYCODONE-ACETAMINOPHEN 5-325 MG PO TABS
1.0000 | ORAL_TABLET | ORAL | Status: DC | PRN
  Administered 2012-12-24: 2 via ORAL
  Filled 2012-12-24: qty 2

## 2012-12-24 MED ORDER — HEPARIN SODIUM (PORCINE) 1000 UNIT/ML IJ SOLN
INTRAMUSCULAR | Status: AC
  Filled 2012-12-24: qty 1

## 2012-12-24 NOTE — H&P (Signed)
  Please see office visit notes for complete details of HPI.  

## 2012-12-24 NOTE — CV Procedure (Addendum)
Procedure performed:  Left heart catheterization including hemodynamic monitoring of the left ventricle, LV gram, selective right and left coronary arteriography.  Indication patient is a 57 year-old Caucasian male with history of MI, known occluded RCA by remote cath, hypertension,  hyperlipidemia,  Diabetes Mellitus   who presents with recurrent chest pain. Patient has  had non invasive testing which was abnormal with intermediate risk scan.  The stress test was performed a year and a half ago, however due to recurrent presentation to the emergency room and also to the outpatient setting with discomfort, he was brought to the coronary angiography suite to evaluate his coronary anatomy.    Hemodynamic data:  Left ventricular pressure was 119/6 with LVEDP of 0 mm mercury. Aortic pressure was 121/70 with a mean of 90 mm mercury. There was no pressure gradient across the aortic valve  Left ventricle: Performed in the RAO projection revealed LVEF of 65-70% with mid cavitary obliteration. There was No significant MR. No wall motion abnormality.  Right coronary artery: The vessel is moderate caliber vessel and is occluded in the proximal segment. There are bridging collaterals, contralateral collaterals from the left coronary arteries to the distal RCA. The CTO is very long and not conducive for percutaneous revascularization.  Left main coronary artery is short and bifurcates immediately.  Circumflex coronary artery: A large vessel giving origin to a large high obtuse marginal 1. There is also a moderate to large sized OM 2. They have mild luminal irregularity, OM 2 proximal segment revealing a 20-30% luminal irregularity. Circumflex mid segment has a 20-30% stenosis. Gives origin to large  AV groove branch, and collaterals to the distal RCA    LAD:  LAD gives origin to   several small diagonals. LAD has mild diffuse luminal irregularity. It wraps around the apex. Proximal segment of the LAD shows a  10-20% luminal irregularity. There is moderate amount of calcium in the proximal LAD.   Impression: Single vessel coronary artery disease involving chronically occluded RCA, collateralized both ipsilateral and contralateral, with hyperdynamic left ventricle with ejection fraction of 65-75%. There was mid cavitary obliteration. A total of 50 cc of contrast was utilized for diagnostic angiography   Recommendation: Medical therapy. Evaluation for noncardiac cause of chest pain. The CTO should not give him rest pain.  Technique: Under sterile precautions using a 6 French right radial  arterial access, a 6 French sheath was introduced into the right radial artery. A 5 [place] Tig 4 catheter was advanced into the ascending aorta selective  right coronary artery and left coronary artery was cannulated and angiography was performed in multiple views. The catheter was pulled back Out of the body over exchange length J-wire. Same Catheter was used to perform LV gram which was performed in RAO projection.  Catheter exchanged out of the body over J-Wire. NO immediate complications noted. Patient tolerated the procedure well. Disposition: Will be discharged home today with outpatient follow up.

## 2012-12-24 NOTE — Interval H&P Note (Signed)
History and Physical Interval Note:  12/24/2012 8:36 AM  [name]  has presented today for surgery, with the diagnosis of Chest pain  The various methods of treatment have been discussed with the patient and family. After consideration of risks, benefits and other options for treatment, the patient has consented to  Procedure(s): LEFT HEART CATHETERIZATION WITH CORONARY ANGIOGRAM (N/A) and possible PCI as a surgical intervention .  The patient's history has been reviewed, patient examined, no change in status, stable for surgery.  I have reviewed the patient's chart and labs.  Questions were answered to the patient's satisfaction.   Cath Lab Visit (complete for each Cath Lab visit)  Clinical Evaluation Leading to the Procedure:   ACS: no  Non-ACS:    Anginal Classification: CCS III  Anti-ischemic medical therapy: Minimal Therapy (1 class of medications)  Non-Invasive Test Results: Intermediate-risk stress test findings: cardiac mortality 1-3%/year  Prior CABG: No previous CABG        [facility]

## 2013-06-02 ENCOUNTER — Emergency Department (HOSPITAL_COMMUNITY): Payer: Medicaid Other

## 2013-06-02 ENCOUNTER — Emergency Department (HOSPITAL_COMMUNITY)
Admission: EM | Admit: 2013-06-02 | Discharge: 2013-06-03 | Disposition: A | Discharge status: Other Acute Inpatient Hospital | Payer: Medicaid Other | Attending: Emergency Medicine | Admitting: Emergency Medicine

## 2013-06-02 ENCOUNTER — Encounter (HOSPITAL_COMMUNITY): Payer: Self-pay | Admitting: Emergency Medicine

## 2013-06-02 DIAGNOSIS — Z7982 Long term (current) use of aspirin: Secondary | ICD-10-CM | POA: Insufficient documentation

## 2013-06-02 DIAGNOSIS — E7800 Pure hypercholesterolemia, unspecified: Secondary | ICD-10-CM | POA: Insufficient documentation

## 2013-06-02 DIAGNOSIS — IMO0002 Reserved for concepts with insufficient information to code with codable children: Secondary | ICD-10-CM | POA: Insufficient documentation

## 2013-06-02 DIAGNOSIS — F1910 Other psychoactive substance abuse, uncomplicated: Secondary | ICD-10-CM

## 2013-06-02 DIAGNOSIS — J45909 Unspecified asthma, uncomplicated: Secondary | ICD-10-CM | POA: Insufficient documentation

## 2013-06-02 DIAGNOSIS — F32A Depression, unspecified: Secondary | ICD-10-CM | POA: Diagnosis present

## 2013-06-02 DIAGNOSIS — I252 Old myocardial infarction: Secondary | ICD-10-CM | POA: Insufficient documentation

## 2013-06-02 DIAGNOSIS — F3289 Other specified depressive episodes: Secondary | ICD-10-CM | POA: Insufficient documentation

## 2013-06-02 DIAGNOSIS — R079 Chest pain, unspecified: Secondary | ICD-10-CM | POA: Insufficient documentation

## 2013-06-02 DIAGNOSIS — F1410 Cocaine abuse, uncomplicated: Secondary | ICD-10-CM | POA: Insufficient documentation

## 2013-06-02 DIAGNOSIS — E119 Type 2 diabetes mellitus without complications: Secondary | ICD-10-CM | POA: Insufficient documentation

## 2013-06-02 DIAGNOSIS — I10 Essential (primary) hypertension: Secondary | ICD-10-CM | POA: Insufficient documentation

## 2013-06-02 DIAGNOSIS — F329 Major depressive disorder, single episode, unspecified: Secondary | ICD-10-CM

## 2013-06-02 DIAGNOSIS — F17200 Nicotine dependence, unspecified, uncomplicated: Secondary | ICD-10-CM | POA: Insufficient documentation

## 2013-06-02 DIAGNOSIS — F1994 Other psychoactive substance use, unspecified with psychoactive substance-induced mood disorder: Secondary | ICD-10-CM | POA: Insufficient documentation

## 2013-06-02 DIAGNOSIS — F1010 Alcohol abuse, uncomplicated: Secondary | ICD-10-CM | POA: Insufficient documentation

## 2013-06-02 DIAGNOSIS — Z791 Long term (current) use of non-steroidal anti-inflammatories (NSAID): Secondary | ICD-10-CM | POA: Insufficient documentation

## 2013-06-02 DIAGNOSIS — Z79899 Other long term (current) drug therapy: Secondary | ICD-10-CM | POA: Insufficient documentation

## 2013-06-02 DIAGNOSIS — Z87828 Personal history of other (healed) physical injury and trauma: Secondary | ICD-10-CM | POA: Insufficient documentation

## 2013-06-02 HISTORY — DX: Depression, unspecified: F32.A

## 2013-06-02 HISTORY — DX: Major depressive disorder, single episode, unspecified: F32.9

## 2013-06-02 LAB — BASIC METABOLIC PANEL
BUN: 14 mg/dL (ref 6–23)
CALCIUM: 9.4 mg/dL (ref 8.4–10.5)
CO2: 22 mEq/L (ref 19–32)
Chloride: 99 mEq/L (ref 96–112)
Creatinine, Ser: 1.01 mg/dL (ref 0.50–1.35)
GFR calc Af Amer: >90 mL/min (ref >90)
GFR calc non Af Amer: 81 mL/min — ABNORMAL LOW (ref >90)
Glucose, Bld: 118 mg/dL — ABNORMAL HIGH (ref 70–99)
Potassium: 4.1 mEq/L (ref 3.7–5.3)
SODIUM: 140 meq/L (ref 137–147)

## 2013-06-02 LAB — CBC
HCT: 47.3 % (ref 39.0–52.0)
HEMOGLOBIN: 16.3 g/dL (ref 13.0–17.0)
MCH: 32.2 pg (ref 26.0–34.0)
MCHC: 34.5 g/dL (ref 30.0–36.0)
MCV: 93.5 fL (ref 78.0–100.0)
Platelets: 385 10*3/uL (ref 150–400)
RBC: 5.06 MIL/uL (ref 4.22–5.81)
RDW: 13.9 % (ref 11.5–15.5)
WBC: 15.9 10*3/uL — ABNORMAL HIGH (ref 4.0–10.5)

## 2013-06-02 LAB — TROPONIN I: Troponin I: <0.3 ng/mL (ref <0.30)

## 2013-06-02 LAB — PROTIME-INR
INR: 1.01 (ref 0.00–1.49)
PROTHROMBIN TIME: 13.1 s (ref 11.6–15.2)

## 2013-06-02 LAB — ETHANOL: Alcohol, Ethyl (B): <11 mg/dL (ref 0–11)

## 2013-06-02 LAB — APTT: aPTT: 26 seconds (ref 24–37)

## 2013-06-02 MED ORDER — ASPIRIN 81 MG PO CHEW
324.0000 mg | CHEWABLE_TABLET | Freq: Once | ORAL | Status: AC
  Administered 2013-06-02: 324 mg via ORAL
  Filled 2013-06-02: qty 4

## 2013-06-02 MED ORDER — SODIUM CHLORIDE 0.9 % IV BOLUS (SEPSIS)
1000.0000 mL | Freq: Once | INTRAVENOUS | Status: AC
  Administered 2013-06-02: 1000 mL via INTRAVENOUS

## 2013-06-02 MED ORDER — MORPHINE SULFATE 4 MG/ML IJ SOLN
4.0000 mg | Freq: Once | INTRAMUSCULAR | Status: AC
  Administered 2013-06-02: 4 mg via INTRAVENOUS
  Filled 2013-06-02: qty 1

## 2013-06-02 MED ORDER — ONDANSETRON 8 MG PO TBDP
8.0000 mg | ORAL_TABLET | Freq: Once | ORAL | Status: AC
  Administered 2013-06-02: 8 mg via ORAL
  Filled 2013-06-02: qty 1

## 2013-06-02 NOTE — ED Notes (Signed)
Pt reports stabbing chest pain and bilateral arm numbness with vomiting. Pt ambulatory to restroom.

## 2013-06-02 NOTE — ED Notes (Addendum)
[facility] for [facility], last use around 1400

## 2013-06-03 ENCOUNTER — Encounter (HOSPITAL_COMMUNITY): Payer: Self-pay | Admitting: *Deleted

## 2013-06-03 DIAGNOSIS — F1994 Other psychoactive substance use, unspecified with psychoactive substance-induced mood disorder: Secondary | ICD-10-CM | POA: Diagnosis present

## 2013-06-03 DIAGNOSIS — F329 Major depressive disorder, single episode, unspecified: Secondary | ICD-10-CM | POA: Diagnosis present

## 2013-06-03 DIAGNOSIS — F32A Depression, unspecified: Secondary | ICD-10-CM | POA: Diagnosis present

## 2013-06-03 DIAGNOSIS — F1910 Other psychoactive substance abuse, uncomplicated: Secondary | ICD-10-CM | POA: Diagnosis present

## 2013-06-03 LAB — I-STAT TROPONIN, ED: TROPONIN I, POC: 0.01 ng/mL (ref 0.00–0.08)

## 2013-06-03 LAB — RAPID URINE DRUG SCREEN, HOSP PERFORMED
Amphetamines: NOT DETECTED
BARBITURATES: NOT DETECTED
Benzodiazepines: POSITIVE — AB
Cocaine: POSITIVE — AB
OPIATES: POSITIVE — AB
TETRAHYDROCANNABINOL: POSITIVE — AB

## 2013-06-03 MED ORDER — HYDROXYZINE HCL 25 MG PO TABS: 25.0000 mg | ORAL_TABLET | Freq: Four times a day (QID) | ORAL | Status: DC | PRN

## 2013-06-03 MED ORDER — METFORMIN HCL 500 MG PO TABS
500.0000 mg | ORAL_TABLET | Freq: Two times a day (BID) | ORAL | Status: DC
  Administered 2013-06-03: 500 mg via ORAL
  Filled 2013-06-03 (×3): qty 1

## 2013-06-03 MED ORDER — CHLORDIAZEPOXIDE HCL 25 MG PO CAPS
25.0000 mg | ORAL_CAPSULE | Freq: Four times a day (QID) | ORAL | Status: DC
  Administered 2013-06-03: 25 mg via ORAL
  Filled 2013-06-03: qty 1

## 2013-06-03 MED ORDER — ACETAMINOPHEN 325 MG PO TABS
650.0000 mg | ORAL_TABLET | Freq: Once | ORAL | Status: AC
  Administered 2013-06-03: 650 mg via ORAL
  Filled 2013-06-03: qty 2

## 2013-06-03 MED ORDER — THIAMINE HCL 100 MG/ML IJ SOLN
100.0000 mg | Freq: Once | INTRAMUSCULAR | Status: AC
  Administered 2013-06-03: 100 mg via INTRAMUSCULAR
  Filled 2013-06-03: qty 2

## 2013-06-03 MED ORDER — LORAZEPAM 1 MG PO TABS
0.0000 mg | ORAL_TABLET | Freq: Four times a day (QID) | ORAL | Status: DC
  Administered 2013-06-03: 1 mg via ORAL
  Filled 2013-06-03: qty 1

## 2013-06-03 MED ORDER — DICYCLOMINE HCL 20 MG PO TABS: 20.0000 mg | ORAL_TABLET | Freq: Four times a day (QID) | ORAL | Status: DC | PRN

## 2013-06-03 MED ORDER — ADULT MULTIVITAMIN W/MINERALS CH
1.0000 | ORAL_TABLET | Freq: Every day | ORAL | Status: DC
  Administered 2013-06-03: 1 via ORAL
  Filled 2013-06-03: qty 1

## 2013-06-03 MED ORDER — VITAMIN B-1 100 MG PO TABS: 100.0000 mg | ORAL_TABLET | Freq: Every day | ORAL | Status: DC

## 2013-06-03 MED ORDER — CHLORDIAZEPOXIDE HCL 25 MG PO CAPS: 25.0000 mg | ORAL_CAPSULE | Freq: Four times a day (QID) | ORAL | Status: DC | PRN

## 2013-06-03 MED ORDER — CHLORDIAZEPOXIDE HCL 25 MG PO CAPS: 25.0000 mg | ORAL_CAPSULE | Freq: Every day | ORAL | Status: DC

## 2013-06-03 MED ORDER — CHLORDIAZEPOXIDE HCL 25 MG PO CAPS
25.0000 mg | ORAL_CAPSULE | Freq: Once | ORAL | Status: AC
Start: 2013-06-03 — End: 2013-06-03
  Administered 2013-06-03: 25 mg via ORAL
  Filled 2013-06-03: qty 1

## 2013-06-03 MED ORDER — METHOCARBAMOL 500 MG PO TABS
500.0000 mg | ORAL_TABLET | Freq: Three times a day (TID) | ORAL | Status: DC | PRN
  Administered 2013-06-03: 500 mg via ORAL
  Filled 2013-06-03: qty 1

## 2013-06-03 MED ORDER — CHLORDIAZEPOXIDE HCL 25 MG PO CAPS: 25.0000 mg | ORAL_CAPSULE | Freq: Three times a day (TID) | ORAL | Status: DC

## 2013-06-03 MED ORDER — CHLORDIAZEPOXIDE HCL 25 MG PO CAPS: 25.0000 mg | ORAL_CAPSULE | ORAL | Status: DC

## 2013-06-03 MED ORDER — CLONIDINE HCL 0.1 MG PO TABS
0.1000 mg | ORAL_TABLET | Freq: Four times a day (QID) | ORAL | Status: DC
  Administered 2013-06-03: 0.1 mg via ORAL
  Filled 2013-06-03: qty 1

## 2013-06-03 MED ORDER — NAPROXEN 500 MG PO TABS: 500.0000 mg | ORAL_TABLET | Freq: Two times a day (BID) | ORAL | Status: DC | PRN

## 2013-06-03 MED ORDER — CLONIDINE HCL 0.1 MG PO TABS: 0.1000 mg | ORAL_TABLET | ORAL | Status: DC

## 2013-06-03 MED ORDER — ONDANSETRON 4 MG PO TBDP: 4.0000 mg | ORAL_TABLET | Freq: Four times a day (QID) | ORAL | Status: DC | PRN

## 2013-06-03 MED ORDER — CLONIDINE HCL 0.1 MG PO TABS: 0.1000 mg | ORAL_TABLET | Freq: Every day | ORAL | Status: DC

## 2013-06-03 MED ORDER — FLUTICASONE PROPIONATE HFA 44 MCG/ACT IN AERO
1.0000 | INHALATION_SPRAY | Freq: Two times a day (BID) | RESPIRATORY_TRACT | Status: DC
  Administered 2013-06-03: 1 via RESPIRATORY_TRACT
  Filled 2013-06-03: qty 10.6

## 2013-06-03 MED ORDER — ESCITALOPRAM OXALATE 10 MG PO TABS
20.0000 mg | ORAL_TABLET | Freq: Every day | ORAL | Status: DC
  Administered 2013-06-03: 20 mg via ORAL
  Filled 2013-06-03: qty 2

## 2013-06-03 MED ORDER — ALBUTEROL SULFATE HFA 108 (90 BASE) MCG/ACT IN AERS: 2.0000 | INHALATION_SPRAY | Freq: Four times a day (QID) | RESPIRATORY_TRACT | Status: DC | PRN

## 2013-06-03 MED ORDER — NITROGLYCERIN 0.4 MG SL SUBL: 0.4000 mg | SUBLINGUAL_TABLET | SUBLINGUAL | Status: DC | PRN

## 2013-06-03 MED ORDER — LORAZEPAM 1 MG PO TABS: 0.0000 mg | ORAL_TABLET | Freq: Two times a day (BID) | ORAL | Status: DC

## 2013-06-03 MED ORDER — ISOSORBIDE MONONITRATE ER 30 MG PO TB24
30.0000 mg | ORAL_TABLET | Freq: Every day | ORAL | Status: DC
  Administered 2013-06-03: 30 mg via ORAL
  Filled 2013-06-03: qty 1

## 2013-06-03 NOTE — BH Assessment (Signed)
Tele Assessment Note   [name] is a 58 y.o. male who presents to [facility] for alcohol, crack/cocaine detox.  Pt denies SI/HI/AVH.  This Clinical research associate attempted to interview pt, however he stated that he didn't want to talk to anyone.  This Clinical research associate encouraged pt to engage in assessment to receive help.  Pt was not forthcoming with information. Pt told this writer that he's been drinking for 1 week, consuming 12 pk of beer and 1 gal of moonshine, daily. Pt would not disclose last drink he had---"I don't know".  Pt also admits using approx $200 worth of crack/cocaine, daily and would not disclose his last use.  Pt denies seizures/blackouts.  Pt is c/o w/d sxs: sweats, tremors, nausea, diarrhea.     Axis I: Alcohol use disorder, Moderate; Cocaine use disorder, Severe Axis II: Deferred Axis III:  Past Medical History  Diagnosis Date  . Hypertension   . Heart attack   . Asthma   . Diabetes mellitus     type 2  . Hypercholesteremia   . Spinal cord injury     pain managed by pain clinic  . Depression    Axis IV: other psychosocial or environmental problems, problems related to social environment and problems with primary support group Axis V: 51-60 moderate symptoms  Past Medical History:  Past Medical History  Diagnosis Date  . Hypertension   . Heart attack   . Asthma   . Diabetes mellitus     type 2  . Hypercholesteremia   . Spinal cord injury     pain managed by pain clinic  . Depression     Past Surgical History  Procedure Laterality Date  . Splenectomy    . Back surgery      Family History:  Family History  Problem Relation Age of Onset  . Allergies Father   . Allergies Sister     x3  . Asthma Mother   . Heart disease Mother   . Heart disease Father   . Clotting disorder Father   . Cancer Father     bone marrow    Social History:  reports that he has been smoking Cigarettes.  He has a 23 pack-year smoking history. He does not have any smokeless tobacco history on  file. He reports that he drinks alcohol. He reports that he uses illicit drugs ("Crack" cocaine).  Additional Social History:  Alcohol / Drug Use Pain Medications: See MAR  Prescriptions: See MAR  Over the Counter: See MAR  History of alcohol / drug use?: Yes Longest period of sobriety (when/how long): Only when in detox  Negative Consequences of Use: Work / School;Personal relationships;Financial Withdrawal Symptoms: Diarrhea;Nausea / Vomiting;Sweats;Tremors Substance #1 Name of Substance 1: Alcohol  1 - Age of First Use: 20's 1 - Amount (size/oz): 12 pk and 1 gal of moonshine  1 - Frequency: Daily  1 - Duration: 1wk  1 - Last Use / Amount: Unk  Substance #2 Name of Substance 2: Crack/Cocaine  2 - Age of First Use: 20's  2 - Amount (size/oz): $200 2 - Frequency: Daily  2 - Duration: On-going  2 - Last Use / Amount: Unk   CIWA: CIWA-Ar BP: 195/83 mmHg Pulse Rate: 72 Nausea and Vomiting: no nausea and no vomiting Tactile Disturbances: none Tremor: not visible, but can be felt fingertip to fingertip Auditory Disturbances: not present Paroxysmal Sweats: no sweat visible Visual Disturbances: not present Anxiety: two Headache, Fullness in Head: none present Agitation: normal activity  Orientation and Clouding of Sensorium: oriented and can do serial additions CIWA-Ar Total: 3 COWS:    Allergies:  Allergies  Allergen Reactions  . Meperidine And Related Itching    Home Medications:  (Not in a hospital admission)  OB/GYN Status:  No LMP for male patient.  General Assessment Data Location of Assessment: [facility] ED Is this a Tele or Face-to-Face Assessment?: Tele Assessment Is this an Initial Assessment or a Re-assessment for this encounter?: Initial Assessment Living Arrangements: Parent (Lives with mother ) Can pt return to current living arrangement?: Yes Admission Status: Voluntary Is patient capable of signing voluntary admission?: Yes Transfer from: Acute  Hospital Referral Source: MD  Medical Screening Exam [facility] Walk-in ONLY) Medical Exam completed: No Reason for MSE not completed: Other:  [facility] Crisis Care Plan Living Arrangements: Parent (Lives with mother ) Name of Psychiatrist: None  Name of Therapist: None   Education Status Is patient currently in school?: No Current Grade: None  Highest grade of school patient has completed: None  Name of school: None  Contact person: None   Risk to self Suicidal Ideation: No Suicidal Intent: No Is patient at risk for suicide?: No Suicidal Plan?: No Access to Means: No What has been your use of drugs/alcohol within the last 12 months?: Abusing alcohol and crack/cocaine  Previous Attempts/Gestures: No How many times?: 0 Other Self Harm Risks: None  Triggers for Past Attempts: None known Intentional Self Injurious Behavior: None Family Suicide History: No Recent stressful life event(s): Other (Comment) (Chronic SA ) Persecutory voices/beliefs?: No Depression: Yes Depression Symptoms: Feeling angry/irritable Substance abuse history and/or treatment for substance abuse?: Yes Suicide prevention information given to non-admitted patients: Not applicable  Risk to Others Homicidal Ideation: No Thoughts of Harm to Others: No Current Homicidal Intent: No Current Homicidal Plan: No Access to Homicidal Means: No Identified Victim: None  History of harm to others?: No Assessment of Violence: None Noted Violent Behavior Description: None Does patient have access to weapons?: No Criminal Charges Pending?: No Does patient have a court date: No  Psychosis Hallucinations: None noted Delusions: None noted  Mental Status Report Appear/Hygiene: Disheveled;Poor hygiene Eye Contact: Poor Motor Activity: Unremarkable Speech: Slurred;Slow Level of Consciousness: Alert Mood: Angry;Irritable Affect: Angry;Irritable Anxiety Level: None Thought Processes: Relevant Judgement:  Unimpaired Orientation: Person;Place;Time;Situation Obsessive Compulsive Thoughts/Behaviors: None  Cognitive Functioning Concentration: Decreased Memory: Recent Intact;Remote Intact IQ: Average Insight: Fair Impulse Control: Fair Appetite: Good Weight Loss: 0 Weight Gain: 0 Sleep: Decreased Total Hours of Sleep: 6 Vegetative Symptoms: None  ADLScreening [facility] Assessment Services) Patient's cognitive ability adequate to safely complete daily activities?: Yes Patient able to express need for assistance with ADLs?: Yes Independently performs ADLs?: Yes (appropriate for developmental age)  Prior Inpatient Therapy Prior Inpatient Therapy: Yes Prior Therapy Dates: 2001,2002 Prior Therapy Facilty/Provider(s): [facility]  Reason for Treatment: Psychosis   Prior Outpatient Therapy Prior Outpatient Therapy: No Prior Therapy Dates: None  Prior Therapy Facilty/Provider(s): None  Reason for Treatment: None   ADL Screening (condition at time of admission) Patient's cognitive ability adequate to safely complete daily activities?: Yes Is the patient deaf or have difficulty hearing?: No Does the patient have difficulty seeing, even when wearing glasses/contacts?: No Does the patient have difficulty concentrating, remembering, or making decisions?: Yes Patient able to express need for assistance with ADLs?: Yes Does the patient have difficulty dressing or bathing?: No Independently performs ADLs?: Yes (appropriate for developmental age) Does the patient have difficulty walking or climbing stairs?: No Weakness of Legs:  None Weakness of Arms/Hands: None  Home Assistive Devices/Equipment Home Assistive Devices/Equipment: None  Therapy Consults (therapy consults require a physician order) PT Evaluation Needed: No OT Evalulation Needed: No SLP Evaluation Needed: No Abuse/Neglect Assessment (Assessment to be complete while patient is alone) Physical Abuse: Denies Verbal Abuse: Denies Sexual  Abuse: Denies Exploitation of patient/patient's resources: Denies Self-Neglect: Denies Values / Beliefs Cultural Requests During Hospitalization: None Spiritual Requests During Hospitalization: None Consults Spiritual Care Consult Needed: No Social Work Consult Needed: No Merchant navy officerAdvance Directives (For Healthcare) Advance Directive: Patient does not have advance directive;Patient would not like information Pre-existing out of facility DNR order (yellow form or pink MOST form): No Nutrition Screen- [facility] Adult/[facility] Patient's home diet: Regular  Additional Information 1:1 In Past 12 Months?: No CIRT Risk: No Elopement Risk: No Does patient have medical clearance?: Yes     Disposition:  Disposition Initial Assessment Completed for this Encounter: Yes Disposition of Patient: Referred to (AM psych eval for final disposition ) Patient referred to: Other (Comment) (AM psych eval for final disposition )  Murrell ReddenSimmons, Jahsiah Carpenter C 06/03/2013 7:29 AM

## 2013-06-03 NOTE — ED Provider Notes (Signed)
CSN: [id number]     Arrival date & time 06/02/13  1827 History   First MD Initiated Contact with Patient 06/02/13 2105     Chief Complaint  Patient presents with  . Withdrawal     (Consider location/radiation/quality/duration/timing/severity/associated sxs/prior Treatment) HPI Comments: Patient presents emergency department with chief complaint of wanting to detox from alcohol. He also states that he has used cocaine. He complains of chest pain that started today. Nothing makes his symptoms better or worse. He states he feels achy all over. There no aggravating or alleviating factors. Denies shortness of breath, nausea, vomiting, fever, or chills.  The history is provided by the patient. No language interpreter was used.    Past Medical History  Diagnosis Date  . Hypertension   . Heart attack   . Asthma   . Diabetes mellitus     type 2  . Hypercholesteremia   . Spinal cord injury     pain managed by pain clinic   Past Surgical History  Procedure Laterality Date  . Splenectomy    . Back surgery     Family History  Problem Relation Age of Onset  . Allergies Father   . Allergies Sister     x3  . Asthma Mother   . Heart disease Mother   . Heart disease Father   . Clotting disorder Father   . Cancer Father     bone marrow   History  Substance Use Topics  . Smoking status: Current Every Day Smoker -- 0.50 packs/day for 46 years    Types: Cigarettes  . Smokeless tobacco: Not on file  . Alcohol Use: No    Review of Systems  Constitutional: Negative for fever and chills.  Respiratory: Negative for shortness of breath.   Cardiovascular: Negative for chest pain.  Gastrointestinal: Negative for nausea, vomiting, diarrhea and constipation.  Genitourinary: Negative for dysuria.      Allergies  Meperidine and related  Home Medications   Current Outpatient Rx  Name  Route  Sig  Dispense  Refill  . albuterol (PROVENTIL HFA;VENTOLIN HFA) 108 (90 BASE) MCG/ACT  inhaler   Inhalation   Inhale 2 puffs into the lungs every 6 (six) hours as needed.         . ALPRAZolam (XANAX) 1 MG tablet   Oral   Take 1 mg by mouth at bedtime as needed for sleep.          Marland Kitchen aspirin EC 325 MG EC tablet   Oral   Take 1 tablet (325 mg total) by mouth daily.   30 tablet   0   . atorvastatin (LIPITOR) 40 MG tablet   Oral   Take 40 mg by mouth at bedtime.         . beclomethasone (QVAR) 80 MCG/ACT inhaler   Inhalation   Inhale 1 puff into the lungs as needed (shortness of breath).          . diclofenac (VOLTAREN) 75 MG EC tablet   Oral   Take 75 mg by mouth 2 (two) times daily.         Marland Kitchen escitalopram (LEXAPRO) 20 MG tablet   Oral   Take 20 mg by mouth daily.         Marland Kitchen gabapentin (NEURONTIN) 300 MG capsule   Oral   Take 300 mg by mouth 3 (three) times daily.         Marland Kitchen glucose blood (ACCU-CHEK AVIVA PLUS) test strip   Other  1 each by Other route as needed. Use as instructed         . isosorbide mononitrate (IMDUR) 30 MG 24 hr tablet   Oral   Take 30 mg by mouth daily.         . metFORMIN (GLUCOPHAGE) 500 MG tablet   Oral   Take 1 tablet (500 mg total) by mouth 2 (two) times daily with a meal.         . nitroGLYCERIN (NITROSTAT) 0.4 MG SL tablet   Sublingual   Place 0.4 mg under the tongue every 5 (five) minutes as needed for chest pain.         Marland Kitchen oxyCODONE-acetaminophen (PERCOCET/ROXICET) 5-325 MG per tablet   Oral   Take 1 tablet by mouth every 4 (four) hours as needed for pain.         . promethazine (PHENERGAN) 25 MG tablet   Oral   Take 25 mg by mouth every 6 (six) hours as needed for nausea.         . ranolazine (RANEXA) 500 MG 12 hr tablet   Oral   Take 500 mg by mouth 2 (two) times daily.          BP 153/62  Pulse 87  Temp(Src) 98.9 F (37.2 C) (Oral)  Resp 18  SpO2 95% Physical Exam  Nursing note and vitals reviewed. Constitutional: He is oriented to person, place, and time. He appears  well-developed and well-nourished.  HENT:  Head: Normocephalic and atraumatic.  Eyes: Conjunctivae and EOM are normal. Pupils are equal, round, and reactive to light. Right eye exhibits no discharge. Left eye exhibits no discharge. No scleral icterus.  Neck: Normal range of motion. Neck supple. No JVD present.  Cardiovascular: Normal rate, regular rhythm and normal heart sounds.  Exam reveals no gallop and no friction rub.   No murmur heard. Pulmonary/Chest: Effort normal and breath sounds normal. No respiratory distress. He has no wheezes. He has no rales. He exhibits no tenderness.  Abdominal: Soft. He exhibits no distension and no mass. There is no tenderness. There is no rebound and no guarding.  Musculoskeletal: Normal range of motion. He exhibits no edema and no tenderness.  Neurological: He is alert and oriented to person, place, and time.  Skin: Skin is warm and dry.  Psychiatric: He has a normal mood and affect. His behavior is normal. Judgment and thought content normal.    ED Course  Procedures (including critical care time) Labs Review Labs Reviewed  CBC - Abnormal; Notable for the following:    WBC 15.9 (*)    All other components within normal limits  BASIC METABOLIC PANEL - Abnormal; Notable for the following:    Glucose, Bld 118 (*)    GFR calc non Af Amer 81 (*)    All other components within normal limits  TROPONIN I  ETHANOL  PROTIME-INR  APTT  URINE RAPID DRUG SCREEN (HOSP PERFORMED)  I-STAT TROPOININ, ED   Imaging Review Dg Chest 2 View  06/02/2013   CLINICAL DATA:  Chest pain  EXAM: CHEST  2 VIEW  COMPARISON:  DG CHEST 1V PORT dated 08/09/2012  FINDINGS: Cardiomediastinal silhouette is unremarkable. The lungs are now clear without pleural effusions or focal consolidations. Mild central pulmonary vasculature congestion. Trachea projects midline and there is no pneumothorax. Soft tissue planes and included osseous structures are non-suspicious. Suspect remote left  posterior rib fractures. Mild degenerative change of the thoracic spine. Multiple EKG lines overlie the patient  and may obscure subtle underlying pathology.  IMPRESSION: Mild central pulmonary vasculature congestion without acute pulmonary process.   Electronically Signed   By: Awilda Metroourtnay  Bloomer   On: 06/02/2013 22:54   Ct Head Wo Contrast  06/02/2013   CLINICAL DATA:  Left arm numbness  EXAM: CT HEAD WITHOUT CONTRAST  TECHNIQUE: Contiguous axial images were obtained from the base of the skull through the vertex without intravenous contrast.  COMPARISON:  CT HEAD W/O CM dated 08/26/2009  FINDINGS: The ventricles and sulci are normal. No intraparenchymal hemorrhage, mass effect nor midline shift. No acute large vascular territory infarcts. Mild patchy supratentorial white matter hypodensities are similar to prior examination.  No abnormal extra-axial fluid collections. Basal cisterns are patent.  No skull fracture. Right maxillary mucosal thickening with atresia consistent with chronic sinusitis. Mild paranasal sinus mucosal thickening. Suspected remote right medial orbital blowout fracture. . The included ocular globes and orbital contents are non-suspicious. Patient is edentulous.  IMPRESSION: No acute intracranial process.  Minimal white matter changes suggest chronic small vessel ischemic disease. If clinical concern for acute ischemia, MRI of the brain with diffusion-weighted sequences would be more sensitive.   Electronically Signed   By: Awilda Metroourtnay  Bloomer   On: 06/02/2013 22:50     EKG Interpretation   Date/Time:  Monday June 02 2013 20:04:21 EDT Ventricular Rate:  84 PR Interval:  179 QRS Duration: 105 QT Interval:  408 QTC Calculation: 482 R Axis:   95 Text Interpretation:  Sinus rhythm LAE, consider biatrial enlargement  Borderline right axis deviation Nonspecific T abnormalities, lateral leads  Borderline prolonged QT interval No significant change since last tracing  Confirmed by BEATON   MD, Kenn ([id number]) on 06/02/2013 11:42:22 PM      MDM   Final diagnoses:  None   Patient with crack cocaine use, chest pain. Check basic labs, and will reevaluate.  Labs are normal. Workup is negative. Discussed patient with Dr. Radford PaxBeaton, who agrees the patient can be discharged.  Patient is requesting detox. We'll move to psych, and place psych hold orders. TTS consult pending.    Roxy Horsemanobert Shilo Pauwels, PA-C 06/03/13 [phone]

## 2013-06-03 NOTE — ED Notes (Signed)
Patient seemed very anxious, had some visible tremors and complaint of headache. Writer notified the physician about that he ordered Tylenol for the headache. Writer administered Ativan for the anxiety. Q 15 minute check continues as ordered for safety.

## 2013-06-03 NOTE — Consult Note (Signed)
Face to face evaluation and I agree with this note 

## 2013-06-03 NOTE — ED Notes (Signed)
Patient resting quietly with eyes closed. Respirations even and unlabored. No distress noted, Q 15 minute check continues as ordered to maintain safety.  

## 2013-06-03 NOTE — ED Notes (Signed)
This is a 58 years old Caucasian male admitted to the unit for substance abuse. Patient reported that he had long history of cocaine abuse but have been sober for sometime and relapsed on cocaine on Sunday 4/5. He said he spent 400-500 dollars on cocaine. He reported he had chest pain, was feeling worthless and felt life not worth living so he came to ED. EKG done at ED and it was negative. He appeared calm during assessment. Denied SI/HI and denied hallucinations. Mood and affect sad and depressed and Speech slurred. Writer encouraged and supported patient. Offered patient some beverage. Q 15 minute check initiated.

## 2013-06-03 NOTE — Consult Note (Signed)
[facility] Face-to-Face Psychiatry Consult   Reason for Consult:  Substance abuse and Depression Referring Physician:  EDP  [name] is an 58 y.o. male. Total Time spent with patient: 45 minutes  Assessment: AXIS I:  Alcohol Abuse, Depressive Disorder NOS, Substance Abuse and Substance Induced Mood Disorder AXIS II:  Deferred AXIS III:   Past Medical History  Diagnosis Date  . Hypertension   . Heart attack   . Asthma   . Diabetes mellitus     type 2  . Hypercholesteremia   . Spinal cord injury     pain managed by pain clinic  . Depression    AXIS IV:  other psychosocial or environmental problems AXIS V:  41-50 serious symptoms  Plan:  Recommend psychiatric Inpatient admission when medically cleared.  Subjective:   [name] is a 58 y.o. male patient admitted with Alcohol Abuse, Depressive Disorder NOS, Substance Abuse and Substance Induced Mood Disorder.  HPI:  Patient states that he has been clean for years and relapsed one week ago using cocaine, marijuana, and alcohol.  Patient states that he is taking pain medication for his back.  "I feel burn out; like life is not worth living.  I got tired and need help to get straight.   Drugs and alcohol is taking my life.  I relapsed 4-5 days.  And I have been drinking white liquor/beer everyday and using cocaine."  Patient states that he doesn't want to hurt himself; but doesn't feel that life is worth living.  No plan.  Denies homicidal ideation, psychosis, and paranoia. Patient states that he has a 7th grade education and minimal reading and writing.   Review of Systems  Constitutional: Positive for chills and diaphoresis.  Musculoskeletal: Positive for back pain (Patient states that he has a spinal cord injury and is on disability related to injury).  Neurological: Positive for tremors and weakness.  Psychiatric/Behavioral: Positive for depression and suicidal ideas (Passive; no plan). Negative for hallucinations and memory  loss. The patient is nervous/anxious.     Past Psychiatric History: Past Medical History  Diagnosis Date  . Hypertension   . Heart attack   . Asthma   . Diabetes mellitus     type 2  . Hypercholesteremia   . Spinal cord injury     pain managed by pain clinic  . Depression     reports that he has been smoking Cigarettes.  He has a 23 pack-year smoking history. He does not have any smokeless tobacco history on file. He reports that he drinks alcohol. He reports that he uses illicit drugs ("Crack" cocaine). Family History  Problem Relation Age of Onset  . Allergies Father   . Allergies Sister     x3  . Asthma Mother   . Heart disease Mother   . Heart disease Father   . Clotting disorder Father   . Cancer Father     bone marrow   Family History Substance Abuse: No Family Supports: Yes, List: (Mother ) Living Arrangements: Parent (Lives with mother ) Can pt return to current living arrangement?: Yes Abuse/Neglect [facility]) Physical Abuse: Denies Verbal Abuse: Denies Sexual Abuse: Denies Allergies:   Allergies  Allergen Reactions  . Meperidine And Related Itching    ACT Assessment Complete:  Yes:    Educational Status    Risk to Self: Risk to self Suicidal Ideation: No Suicidal Intent: No Is patient at risk for suicide?: No Suicidal Plan?: No Access to Means: No What  has been your use of drugs/alcohol within the last 12 months?: Abusing alcohol and crack/cocaine  Previous Attempts/Gestures: No How many times?: 0 Other Self Harm Risks: None  Triggers for Past Attempts: None known Intentional Self Injurious Behavior: None Family Suicide History: No Recent stressful life event(s): Other (Comment) (Chronic SA ) Persecutory voices/beliefs?: No Depression: Yes Depression Symptoms: Feeling angry/irritable Substance abuse history and/or treatment for substance abuse?: Yes Suicide prevention information given to non-admitted patients: Not applicable  Risk to Others: Risk  to Others Homicidal Ideation: No Thoughts of Harm to Others: No Current Homicidal Intent: No Current Homicidal Plan: No Access to Homicidal Means: No Identified Victim: None  History of harm to others?: No Assessment of Violence: None Noted Violent Behavior Description: None Does patient have access to weapons?: No Criminal Charges Pending?: No Does patient have a court date: No  Abuse: Abuse/Neglect Assessment (Assessment to be complete while patient is alone) Physical Abuse: Denies Verbal Abuse: Denies Sexual Abuse: Denies Exploitation of patient/patient's resources: Denies Self-Neglect: Denies  Prior Inpatient Therapy: Prior Inpatient Therapy Prior Inpatient Therapy: Yes Prior Therapy Dates: 2001,2002 Prior Therapy Facilty/Provider(s): [facility]  Reason for Treatment: Psychosis   Prior Outpatient Therapy: Prior Outpatient Therapy Prior Outpatient Therapy: No Prior Therapy Dates: None  Prior Therapy Facilty/Provider(s): None  Reason for Treatment: None   Additional Information: Additional Information 1:1 In Past 12 Months?: No CIRT Risk: No Elopement Risk: No Does patient have medical clearance?: Yes    Objective: Blood pressure 142/84, pulse 72, temperature 98 F (36.7 C), temperature source Oral, resp. rate 18, SpO2 96.00%.There is no weight on file to calculate BMI. Results for orders placed during the hospital encounter of 06/02/13 (from the past 72 hour(s))  CBC     Status: Abnormal   Collection Time    06/02/13  8:36 PM      Result Value Ref Range   WBC 15.9 (*) 4.0 - 10.5 K/uL   RBC 5.06  4.22 - 5.81 MIL/uL   Hemoglobin 16.3  13.0 - 17.0 g/dL   HCT 47.3  39.0 - 52.0 %   MCV 93.5  78.0 - 100.0 fL   MCH 32.2  26.0 - 34.0 pg   MCHC 34.5  30.0 - 36.0 g/dL   RDW 13.9  11.5 - 15.5 %   Platelets 385  150 - 400 K/uL  BASIC METABOLIC PANEL     Status: Abnormal   Collection Time    06/02/13  8:36 PM      Result Value Ref Range   Sodium 140  137 - 147 mEq/L    Potassium 4.1  3.7 - 5.3 mEq/L   Chloride 99  96 - 112 mEq/L   CO2 22  19 - 32 mEq/L   Glucose, Bld 118 (*) 70 - 99 mg/dL   BUN 14  6 - 23 mg/dL   Creatinine, Ser 1.01  0.50 - 1.35 mg/dL   Calcium 9.4  8.4 - 10.5 mg/dL   GFR calc non Af Amer 81 (*) >90 mL/min   GFR calc Af Amer >90  >90 mL/min   Comment: (NOTE)     The eGFR has been calculated using the CKD EPI equation.     This calculation has not been validated in all clinical situations.     eGFR's persistently <90 mL/min signify possible Chronic Kidney     Disease.  TROPONIN I     Status: None   Collection Time    06/02/13  8:36 PM  Result Value Ref Range   Troponin I <0.30  <0.30 ng/mL   Comment:            Due to the release kinetics of cTnI,     a negative result within the first hours     of the onset of symptoms does not rule out     myocardial infarction with certainty.     If myocardial infarction is still suspected,     repeat the test at appropriate intervals.  ETHANOL     Status: None   Collection Time    06/02/13  9:20 PM      Result Value Ref Range   Alcohol, Ethyl (B) <11  0 - 11 mg/dL   Comment:            LOWEST DETECTABLE LIMIT FOR     SERUM ALCOHOL IS 11 mg/dL     FOR MEDICAL PURPOSES ONLY  PROTIME-INR     Status: None   Collection Time    06/02/13  9:20 PM      Result Value Ref Range   Prothrombin Time 13.1  11.6 - 15.2 seconds   INR 1.01  0.00 - 1.49  APTT     Status: None   Collection Time    06/02/13  9:20 PM      Result Value Ref Range   aPTT 26  24 - 37 seconds  I-STAT TROPOININ, ED     Status: None   Collection Time    06/03/13 12:03 AM      Result Value Ref Range   Troponin i, poc 0.01  0.00 - 0.08 ng/mL   Comment 3            Comment: Due to the release kinetics of cTnI,     a negative result within the first hours     of the onset of symptoms does not rule out     myocardial infarction with certainty.     If myocardial infarction is still suspected,     repeat the test at  appropriate intervals.  URINE RAPID DRUG SCREEN (HOSP PERFORMED)     Status: Abnormal   Collection Time    06/03/13  5:52 AM      Result Value Ref Range   Opiates POSITIVE (*) NONE DETECTED   Cocaine POSITIVE (*) NONE DETECTED   Benzodiazepines POSITIVE (*) NONE DETECTED   Amphetamines NONE DETECTED  NONE DETECTED   Tetrahydrocannabinol POSITIVE (*) NONE DETECTED   Barbiturates NONE DETECTED  NONE DETECTED   Comment:            DRUG SCREEN FOR MEDICAL PURPOSES     ONLY.  IF CONFIRMATION IS NEEDED     FOR ANY PURPOSE, NOTIFY LAB     WITHIN 5 DAYS.                LOWEST DETECTABLE LIMITS     FOR URINE DRUG SCREEN     Drug Class       Cutoff (ng/mL)     Amphetamine      1000     Barbiturate      200     Benzodiazepine   891     Tricyclics       694     Opiates          300     Cocaine          300     THC  65   Labs are reviewed and are pertinent for UDS positive for cocaine, benzo, opiates, and THC.  ETOH < 11.  Home medications reviewed and started.  Patient also started on Librium and Clonidine protocols for alcohol/benzo and opiate detox.  Current Facility-Administered Medications  Medication Dose Route Frequency Provider Last Rate Last Dose  . albuterol (PROVENTIL HFA;VENTOLIN HFA) 108 (90 BASE) MCG/ACT inhaler 2 puff  2 puff Inhalation Q6H PRN Shuvon Rankin, NP      . chlordiazePOXIDE (LIBRIUM) capsule 25 mg  25 mg Oral Q6H PRN Shuvon Rankin, NP      . chlordiazePOXIDE (LIBRIUM) capsule 25 mg  25 mg Oral Once Shuvon Rankin, NP      . chlordiazePOXIDE (LIBRIUM) capsule 25 mg  25 mg Oral QID Shuvon Rankin, NP       Followed by  . [START ON 06/04/2013] chlordiazePOXIDE (LIBRIUM) capsule 25 mg  25 mg Oral TID Shuvon Rankin, NP       Followed by  . [START ON 06/05/2013] chlordiazePOXIDE (LIBRIUM) capsule 25 mg  25 mg Oral BH-qamhs Shuvon Rankin, NP       Followed by  . [START ON 06/06/2013] chlordiazePOXIDE (LIBRIUM) capsule 25 mg  25 mg Oral Daily Shuvon Rankin, NP       . cloNIDine (CATAPRES) tablet 0.1 mg  0.1 mg Oral QID Shuvon Rankin, NP       Followed by  . [START ON 06/05/2013] cloNIDine (CATAPRES) tablet 0.1 mg  0.1 mg Oral BH-qamhs Shuvon Rankin, NP       Followed by  . [START ON 06/07/2013] cloNIDine (CATAPRES) tablet 0.1 mg  0.1 mg Oral QAC breakfast Shuvon Rankin, NP      . dicyclomine (BENTYL) tablet 20 mg  20 mg Oral Q6H PRN Shuvon Rankin, NP      . escitalopram (LEXAPRO) tablet 20 mg  20 mg Oral Daily Shuvon Rankin, NP      . fluticasone (FLOVENT HFA) 44 MCG/ACT inhaler 1 puff  1 puff Inhalation BID Shuvon Rankin, NP      . hydrOXYzine (ATARAX/VISTARIL) tablet 25 mg  25 mg Oral Q6H PRN Shuvon Rankin, NP      . isosorbide mononitrate (IMDUR) 24 hr tablet 30 mg  30 mg Oral Daily Shuvon Rankin, NP      . metFORMIN (GLUCOPHAGE) tablet 500 mg  500 mg Oral BID WC Shuvon Rankin, NP      . methocarbamol (ROBAXIN) tablet 500 mg  500 mg Oral Q8H PRN Shuvon Rankin, NP      . multivitamin with minerals tablet 1 tablet  1 tablet Oral Daily Shuvon Rankin, NP      . naproxen (NAPROSYN) tablet 500 mg  500 mg Oral BID PRN Shuvon Rankin, NP      . nitroGLYCERIN (NITROSTAT) SL tablet 0.4 mg  0.4 mg Sublingual Q5 min PRN Shuvon Rankin, NP      . ondansetron (ZOFRAN-ODT) disintegrating tablet 4 mg  4 mg Oral Q6H PRN Shuvon Rankin, NP      . thiamine (B-1) injection 100 mg  100 mg Intramuscular Once Shuvon Rankin, NP      . [START ON 06/04/2013] thiamine (VITAMIN B-1) tablet 100 mg  100 mg Oral Daily Shuvon Rankin, NP       Current Outpatient Prescriptions  Medication Sig Dispense Refill  . albuterol (PROVENTIL HFA;VENTOLIN HFA) 108 (90 BASE) MCG/ACT inhaler Inhale 2 puffs into the lungs every 6 (six) hours as needed.      . ALPRAZolam Duanne Moron)  1 MG tablet Take 1 mg by mouth at bedtime as needed for sleep.       . beclomethasone (QVAR) 80 MCG/ACT inhaler Inhale 1 puff into the lungs as needed (shortness of breath).       . escitalopram (LEXAPRO) 20 MG tablet Take 20 mg  by mouth daily.      Marland Kitchen gabapentin (NEURONTIN) 300 MG capsule Take 300 mg by mouth 3 (three) times daily.      Marland Kitchen glucose blood (ACCU-CHEK AVIVA PLUS) test strip 1 each by Other route as needed. Use as instructed      . isosorbide mononitrate (IMDUR) 30 MG 24 hr tablet Take 30 mg by mouth daily.      . metFORMIN (GLUCOPHAGE) 500 MG tablet Take 1 tablet (500 mg total) by mouth 2 (two) times daily with a meal.      . nitroGLYCERIN (NITROSTAT) 0.4 MG SL tablet Place 0.4 mg under the tongue every 5 (five) minutes as needed for chest pain.      Marland Kitchen oxyCODONE-acetaminophen (PERCOCET/ROXICET) 5-325 MG per tablet Take 1 tablet by mouth every 4 (four) hours as needed for pain.      . promethazine (PHENERGAN) 25 MG tablet Take 25 mg by mouth every 6 (six) hours as needed for nausea.      Treatment Plan Summary: Daily contact with patient to assess and evaluate symptoms and progress in treatment Medication management  Disposition:  Agree with TTS assessment for inpatient treatment.  Patient has been accepted to [place] [facility] hall.  Debarah Crape, RN [facility]) will call with bed number and time for transfer.  Monitor for safety and stabilization until patient is ready for transfer.  Earleen Newport, FNP-BC 06/03/2013 9:53 AM

## 2013-06-07 NOTE — ED Provider Notes (Signed)
Medical screening examination/treatment/procedure(s) were performed by non-physician practitioner and as supervising physician I was immediately available for consultation/collaboration.   Sasha L Noorah Giammona, MD 06/07/13 1119 

## 2013-12-28 DEATH — deceased

## 2014-02-05 ENCOUNTER — Encounter (HOSPITAL_COMMUNITY): Payer: Self-pay | Admitting: Cardiology
